# Patient Record
Sex: Female | Born: 1955 | Race: White | Hispanic: No | Smoking: Never smoker
Health system: Southern US, Community
[De-identification: ages and names within clinical notes are randomized; demographics above are authoritative.]

## PROBLEM LIST (undated history)

## (undated) DIAGNOSIS — M1990 Unspecified osteoarthritis, unspecified site: Secondary | ICD-10-CM

## (undated) DIAGNOSIS — I10 Essential (primary) hypertension: Secondary | ICD-10-CM

## (undated) DIAGNOSIS — Z7901 Long term (current) use of anticoagulants: Secondary | ICD-10-CM

## (undated) DIAGNOSIS — E785 Hyperlipidemia, unspecified: Secondary | ICD-10-CM

## (undated) DIAGNOSIS — K219 Gastro-esophageal reflux disease without esophagitis: Secondary | ICD-10-CM

## (undated) DIAGNOSIS — R7303 Prediabetes: Secondary | ICD-10-CM

## (undated) HISTORY — PX: BACK SURGERY: SHX140

## (undated) HISTORY — PX: ABDOMINAL HYSTERECTOMY: SHX81

## (undated) HISTORY — PX: OTHER SURGICAL HISTORY: SHX169

---

## 2013-11-03 ENCOUNTER — Ambulatory Visit: Payer: Self-pay | Admitting: Internal Medicine

## 2014-11-23 DIAGNOSIS — E7800 Pure hypercholesterolemia, unspecified: Secondary | ICD-10-CM | POA: Insufficient documentation

## 2014-11-23 DIAGNOSIS — M170 Bilateral primary osteoarthritis of knee: Secondary | ICD-10-CM | POA: Insufficient documentation

## 2014-11-28 ENCOUNTER — Other Ambulatory Visit: Payer: Self-pay | Admitting: Internal Medicine

## 2014-11-28 DIAGNOSIS — Z1231 Encounter for screening mammogram for malignant neoplasm of breast: Secondary | ICD-10-CM

## 2014-12-13 ENCOUNTER — Ambulatory Visit
Admission: RE | Admit: 2014-12-13 | Discharge: 2014-12-13 | Disposition: A | Discharge status: Home / Self Care | Payer: BLUE CROSS/BLUE SHIELD | Source: Ambulatory Visit | Attending: Internal Medicine | Admitting: Internal Medicine

## 2014-12-13 DIAGNOSIS — Z1231 Encounter for screening mammogram for malignant neoplasm of breast: Secondary | ICD-10-CM | POA: Insufficient documentation

## 2014-12-19 ENCOUNTER — Other Ambulatory Visit: Payer: Self-pay | Admitting: Internal Medicine

## 2014-12-19 DIAGNOSIS — R928 Other abnormal and inconclusive findings on diagnostic imaging of breast: Secondary | ICD-10-CM

## 2014-12-21 ENCOUNTER — Ambulatory Visit: Payer: BLUE CROSS/BLUE SHIELD

## 2014-12-21 ENCOUNTER — Ambulatory Visit
Admission: RE | Admit: 2014-12-21 | Discharge: 2014-12-21 | Disposition: A | Discharge status: Home / Self Care | Payer: BLUE CROSS/BLUE SHIELD | Source: Ambulatory Visit | Attending: Internal Medicine | Admitting: Internal Medicine

## 2014-12-21 DIAGNOSIS — R928 Other abnormal and inconclusive findings on diagnostic imaging of breast: Secondary | ICD-10-CM | POA: Diagnosis not present

## 2015-11-26 ENCOUNTER — Other Ambulatory Visit: Payer: Self-pay | Admitting: Internal Medicine

## 2015-11-26 DIAGNOSIS — Z1231 Encounter for screening mammogram for malignant neoplasm of breast: Secondary | ICD-10-CM

## 2015-12-09 ENCOUNTER — Ambulatory Visit
Admission: RE | Admit: 2015-12-09 | Discharge: 2015-12-09 | Disposition: A | Discharge status: Home / Self Care | Payer: BLUE CROSS/BLUE SHIELD | Source: Ambulatory Visit | Attending: Internal Medicine | Admitting: Internal Medicine

## 2015-12-09 DIAGNOSIS — Z1231 Encounter for screening mammogram for malignant neoplasm of breast: Secondary | ICD-10-CM | POA: Insufficient documentation

## 2016-11-27 ENCOUNTER — Other Ambulatory Visit: Payer: Self-pay | Admitting: Internal Medicine

## 2016-11-27 DIAGNOSIS — I10 Essential (primary) hypertension: Secondary | ICD-10-CM | POA: Insufficient documentation

## 2016-11-27 DIAGNOSIS — Z1231 Encounter for screening mammogram for malignant neoplasm of breast: Secondary | ICD-10-CM

## 2016-12-18 ENCOUNTER — Ambulatory Visit
Admission: RE | Admit: 2016-12-18 | Discharge: 2016-12-18 | Disposition: A | Discharge status: Home / Self Care | Payer: BLUE CROSS/BLUE SHIELD | Source: Ambulatory Visit | Attending: Internal Medicine | Admitting: Internal Medicine

## 2016-12-18 DIAGNOSIS — Z1231 Encounter for screening mammogram for malignant neoplasm of breast: Secondary | ICD-10-CM | POA: Diagnosis not present

## 2017-12-20 ENCOUNTER — Other Ambulatory Visit: Payer: Self-pay | Admitting: Internal Medicine

## 2017-12-20 DIAGNOSIS — Z1231 Encounter for screening mammogram for malignant neoplasm of breast: Secondary | ICD-10-CM

## 2018-01-07 ENCOUNTER — Ambulatory Visit
Admission: RE | Admit: 2018-01-07 | Discharge: 2018-01-07 | Disposition: A | Discharge status: Home / Self Care | Payer: BLUE CROSS/BLUE SHIELD | Source: Ambulatory Visit | Attending: Internal Medicine | Admitting: Internal Medicine

## 2018-01-07 DIAGNOSIS — Z1231 Encounter for screening mammogram for malignant neoplasm of breast: Secondary | ICD-10-CM | POA: Diagnosis not present

## 2018-04-01 ENCOUNTER — Ambulatory Visit: Admit: 2018-04-01 | Payer: BLUE CROSS/BLUE SHIELD | Admitting: Unknown Physician Specialty

## 2018-04-01 SURGERY — COLONOSCOPY WITH PROPOFOL
Anesthesia: General

## 2018-12-09 DIAGNOSIS — R7303 Prediabetes: Secondary | ICD-10-CM | POA: Insufficient documentation

## 2018-12-15 ENCOUNTER — Other Ambulatory Visit: Payer: Self-pay | Admitting: Internal Medicine

## 2018-12-15 DIAGNOSIS — Z1231 Encounter for screening mammogram for malignant neoplasm of breast: Secondary | ICD-10-CM

## 2019-01-10 ENCOUNTER — Other Ambulatory Visit: Payer: Self-pay

## 2019-01-10 ENCOUNTER — Ambulatory Visit
Admission: RE | Admit: 2019-01-10 | Discharge: 2019-01-10 | Disposition: A | Discharge status: Home / Self Care | Payer: BC Managed Care – PPO | Source: Ambulatory Visit | Attending: Internal Medicine | Admitting: Internal Medicine

## 2019-01-10 DIAGNOSIS — Z1231 Encounter for screening mammogram for malignant neoplasm of breast: Secondary | ICD-10-CM | POA: Insufficient documentation

## 2020-01-10 ENCOUNTER — Other Ambulatory Visit: Payer: Self-pay | Admitting: Internal Medicine

## 2020-01-10 DIAGNOSIS — Z1231 Encounter for screening mammogram for malignant neoplasm of breast: Secondary | ICD-10-CM

## 2020-01-12 LAB — COLOGUARD

## 2020-01-12 LAB — EXTERNAL GENERIC LAB PROCEDURE

## 2020-01-25 LAB — EXTERNAL GENERIC LAB PROCEDURE: COLOGUARD: NEGATIVE

## 2020-01-25 LAB — COLOGUARD: COLOGUARD: NEGATIVE

## 2020-02-15 ENCOUNTER — Other Ambulatory Visit
Admission: RE | Admit: 2020-02-15 | Discharge: 2020-02-15 | Disposition: A | Discharge status: Home / Self Care | Payer: BC Managed Care – PPO | Source: Ambulatory Visit | Attending: Gastroenterology | Admitting: Gastroenterology

## 2020-02-15 ENCOUNTER — Other Ambulatory Visit: Payer: Self-pay

## 2020-02-15 DIAGNOSIS — Z20822 Contact with and (suspected) exposure to covid-19: Secondary | ICD-10-CM | POA: Insufficient documentation

## 2020-02-15 LAB — SARS CORONAVIRUS 2 (TAT 6-24 HRS): SARS Coronavirus 2: NEGATIVE

## 2020-02-16 ENCOUNTER — Ambulatory Visit
Admission: RE | Admit: 2020-02-16 | Discharge: 2020-02-16 | Disposition: A | Discharge status: Home / Self Care | Payer: BC Managed Care – PPO | Source: Ambulatory Visit | Attending: Internal Medicine | Admitting: Internal Medicine

## 2020-02-16 DIAGNOSIS — Z1231 Encounter for screening mammogram for malignant neoplasm of breast: Secondary | ICD-10-CM | POA: Insufficient documentation

## 2020-02-19 ENCOUNTER — Encounter
Admission: RE | Disposition: A | Discharge status: Home / Self Care | Payer: Self-pay | Source: Home / Self Care | Attending: Gastroenterology

## 2020-02-19 ENCOUNTER — Encounter: Payer: Self-pay | Admitting: *Deleted

## 2020-02-19 ENCOUNTER — Ambulatory Visit: Payer: BC Managed Care – PPO | Admitting: Certified Registered"

## 2020-02-19 ENCOUNTER — Ambulatory Visit
Admission: RE | Admit: 2020-02-19 | Discharge: 2020-02-19 | Disposition: A | Discharge status: Home / Self Care | Payer: BC Managed Care – PPO | Attending: Gastroenterology | Admitting: Gastroenterology

## 2020-02-19 DIAGNOSIS — I10 Essential (primary) hypertension: Secondary | ICD-10-CM | POA: Diagnosis not present

## 2020-02-19 DIAGNOSIS — K2100 Gastro-esophageal reflux disease with esophagitis, without bleeding: Secondary | ICD-10-CM | POA: Diagnosis not present

## 2020-02-19 DIAGNOSIS — K2960 Other gastritis without bleeding: Secondary | ICD-10-CM | POA: Insufficient documentation

## 2020-02-19 DIAGNOSIS — Z79899 Other long term (current) drug therapy: Secondary | ICD-10-CM | POA: Diagnosis not present

## 2020-02-19 DIAGNOSIS — R1310 Dysphagia, unspecified: Secondary | ICD-10-CM | POA: Insufficient documentation

## 2020-02-19 HISTORY — DX: Essential (primary) hypertension: I10

## 2020-02-19 HISTORY — PX: ESOPHAGOGASTRODUODENOSCOPY (EGD) WITH PROPOFOL: SHX5813

## 2020-02-19 HISTORY — DX: Gastro-esophageal reflux disease without esophagitis: K21.9

## 2020-02-19 SURGERY — ESOPHAGOGASTRODUODENOSCOPY (EGD) WITH PROPOFOL
Anesthesia: General

## 2020-02-19 MED ORDER — PROPOFOL 10 MG/ML IV BOLUS
INTRAVENOUS | Status: DC | PRN
  Administered 2020-02-19: 40 mg via INTRAVENOUS
  Administered 2020-02-19 (×2): 10 mg via INTRAVENOUS

## 2020-02-19 MED ORDER — PHENYLEPHRINE HCL (PRESSORS) 10 MG/ML IV SOLN
INTRAVENOUS | Status: DC | PRN
  Administered 2020-02-19: 200 ug via INTRAVENOUS

## 2020-02-19 MED ORDER — GLYCOPYRROLATE 0.2 MG/ML IJ SOLN
INTRAMUSCULAR | Status: DC | PRN
  Administered 2020-02-19: .2 mg via INTRAVENOUS

## 2020-02-19 MED ORDER — LIDOCAINE HCL (CARDIAC) PF 100 MG/5ML IV SOSY
PREFILLED_SYRINGE | INTRAVENOUS | Status: DC | PRN
  Administered 2020-02-19: 100 mg via INTRAVENOUS

## 2020-02-19 MED ORDER — SODIUM CHLORIDE 0.9 % IV SOLN
INTRAVENOUS | Status: DC
  Administered 2020-02-19: 1000 mL via INTRAVENOUS

## 2020-02-19 MED ORDER — MIDAZOLAM HCL 2 MG/2ML IJ SOLN
INTRAMUSCULAR | Status: AC
  Filled 2020-02-19: qty 2

## 2020-02-19 MED ORDER — PROPOFOL 500 MG/50ML IV EMUL
INTRAVENOUS | Status: DC | PRN
  Administered 2020-02-19: 145 ug/kg/min via INTRAVENOUS

## 2020-02-19 NOTE — Anesthesia Preprocedure Evaluation (Signed)
Anesthesia Evaluation  Patient identified by MRN, date of birth, ID band Patient awake    Reviewed: Allergy & Precautions, H&P , NPO status , reviewed documented beta blocker date and time   Airway Mallampati: IV  TM Distance: >3 FB Neck ROM: full    Dental  (+) Teeth Intact   Pulmonary neg sleep apnea,    Pulmonary exam normal        Cardiovascular hypertension, Normal cardiovascular exam     Neuro/Psych    GI/Hepatic GERD  Controlled,  Endo/Other    Renal/GU      Musculoskeletal   Abdominal   Peds  Hematology   Anesthesia Other Findings Past Medical History: No date: GERD (gastroesophageal reflux disease) No date: Hypertension  Past Surgical History: No date: ABDOMINAL HYSTERECTOMY No date: BACK SURGERY No date: nodule on back  BMI    Body Mass Index: 47.13 kg/m      Reproductive/Obstetrics                             Anesthesia Physical Anesthesia Plan  ASA: III  Anesthesia Plan: General   Post-op Pain Management:    Induction: Intravenous  PONV Risk Score and Plan: Treatment may vary due to age or medical condition and TIVA  Airway Management Planned: Nasal Cannula and Natural Airway  Additional Equipment:   Intra-op Plan:   Post-operative Plan:   Informed Consent: I have reviewed the patients History and Physical, chart, labs and discussed the procedure including the risks, benefits and alternatives for the proposed anesthesia with the patient or authorized representative who has indicated his/her understanding and acceptance.     Dental Advisory Given  Plan Discussed with: CRNA  Anesthesia Plan Comments:         Anesthesia Quick Evaluation

## 2020-02-19 NOTE — H&P (Signed)
Outpatient short stay form Pre-procedure 02/19/2020 2:43 PM Merlyn Lot MD, MPH  Primary Physician: Dr. Dareen Piano  Reason for visit:  Chronic Dysphagia  History of present illness:  Patient with solid food dysphagia for over a year. No family history of esophageal cancer. No blood thinners.   No current facility-administered medications for this encounter.  Medications Prior to Admission  Medication Sig Dispense Refill Last Dose  . carvedilol (COREG) 12.5 MG tablet Take 12.5 mg by mouth 2 (two) times daily with a meal.    at 0900     No Known Allergies   Past Medical History:  Diagnosis Date  . GERD (gastroesophageal reflux disease)   . Hypertension     Review of systems:  Otherwise negative.    Physical Exam  Gen: Alert, oriented. Appears stated age.  HEENT: PERRLA. Lungs: No respiratory distress Abd: soft, benign, no masses Ext: No edema    Planned procedures: Proceed with EGD. The patient understands the nature of the planned procedure, indications, risks, alternatives and potential complications including but not limited to bleeding, infection, perforation, damage to internal organs and possible oversedation/side effects from anesthesia. The patient agrees and gives consent to proceed.  Please refer to procedure notes for findings, recommendations and patient disposition/instructions.     Merlyn Lot MD, MPH Gastroenterology 02/19/2020  2:43 PM

## 2020-02-19 NOTE — Transfer of Care (Signed)
Immediate Anesthesia Transfer of Care Note  Patient: Meredith Reyes  Procedure(s) Performed: ESOPHAGOGASTRODUODENOSCOPY (EGD) WITH PROPOFOL (N/A )  Patient Location: Endoscopy Unit  Anesthesia Type:General  Level of Consciousness: awake, drowsy and responds to stimulation  Airway & Oxygen Therapy: Patient Spontanous Breathing and Patient connected to face mask oxygen  Post-op Assessment: Report given to RN and Post -op Vital signs reviewed and stable  Post vital signs: Reviewed and stable  Last Vitals:  Vitals Value Taken Time  BP    Temp    Pulse 79 02/19/20 1558  Resp 24 02/19/20 1558  SpO2 99 % 02/19/20 1558  Vitals shown include unvalidated device data.  Last Pain:  Vitals:   02/19/20 1555  TempSrc:   PainSc: 0-No pain      Patients Stated Pain Goal: 0 (02/19/20 1443)  Complications: No complications documented.

## 2020-02-19 NOTE — Interval H&P Note (Signed)
History and Physical Interval Note:  02/19/2020 2:44 PM  Meredith Reyes  has presented today for surgery, with the diagnosis of DYSPHAGIA W /DIL.  The various methods of treatment have been discussed with the patient and family. After consideration of risks, benefits and other options for treatment, the patient has consented to  Procedure(s): ESOPHAGOGASTRODUODENOSCOPY (EGD) WITH PROPOFOL (N/A) as a surgical intervention.  The patient's history has been reviewed, patient examined, no change in status, stable for surgery.  I have reviewed the patient's chart and labs.  Questions were answered to the patient's satisfaction.     Regis Bill  Ok to proceed with EGD

## 2020-02-19 NOTE — Op Note (Signed)
[facility] Gastroenterology Patient Name: Meredith Reyes Procedure Date: 02/19/2020 3:33 PM MRN: [id number] Account #: [id number] Date of Birth: Jul 03, 1956 Admit Type: Outpatient Age: 64 Room: [facility] ENDO ROOM 2 Gender: Female Note Status: Finalized Procedure:             Upper GI endoscopy Indications:           Dysphagia Providers:             Andrey Farmer MD, MD Referring MD:          Ocie Cornfield. Ouida Sills MD, MD (Referring MD) Medicines:             Monitored Anesthesia Care Complications:         No immediate complications. Estimated blood loss:                         Minimal. Procedure:             Pre-Anesthesia Assessment:                        - Prior to the procedure, a History and Physical was                         performed, and patient medications and allergies were                         reviewed. The patient is competent. The risks and                         benefits of the procedure and the sedation options and                         risks were discussed with the patient. All questions                         were answered and informed consent was obtained.                         Patient identification and proposed procedure were                         verified by the physician, the nurse, the anesthetist                         and the technician in the endoscopy suite. Mental                         Status Examination: alert and oriented. Airway                         Examination: normal oropharyngeal airway and neck                         mobility. Respiratory Examination: clear to                         auscultation. CV Examination: normal. Prophylactic  Antibiotics: The patient does not require prophylactic                         antibiotics. Prior Anticoagulants: The patient has                         taken no previous anticoagulant or antiplatelet                         agents. ASA Grade Assessment: III  - A patient with                         severe systemic disease. After reviewing the risks and                         benefits, the patient was deemed in satisfactory                         condition to undergo the procedure. The anesthesia                         plan was to use monitored anesthesia care (MAC).                         Immediately prior to administration of medications,                         the patient was re-assessed for adequacy to receive                         sedatives. The heart rate, respiratory rate, oxygen                         saturations, blood pressure, adequacy of pulmonary                         ventilation, and response to care were monitored                         throughout the procedure. The physical status of the                         patient was re-assessed after the procedure.                        After obtaining informed consent, the endoscope was                         passed under direct vision. Throughout the procedure,                         the patient's blood pressure, pulse, and oxygen                         saturations were monitored continuously. The Endoscope                         was introduced through the mouth, and advanced to the  second part of duodenum. The upper GI endoscopy was                         accomplished without difficulty. The patient tolerated                         the procedure well. Findings:      No endoscopic abnormality was evident in the esophagus to explain the       patient's complaint of dysphagia. Biopsies were obtained from the       proximal and distal esophagus with cold forceps for histology of       suspected eosinophilic esophagitis.      LA Grade A (one or more mucosal breaks less than 5 mm, not extending       between tops of 2 mucosal folds) esophagitis with no bleeding was found.      Patchy mildly erythematous mucosa without bleeding was found in the        gastric antrum. Biopsies were taken with a cold forceps for Helicobacter       pylori testing. Estimated blood loss was minimal.      The examined duodenum was normal. Impression:            - No endoscopic esophageal abnormality to explain                         patient's dysphagia. Biopsied.                        - LA Grade A esophagitis with no bleeding.                        - Erythematous mucosa in the antrum. Biopsied.                        - Normal examined duodenum. Recommendation:        - Discharge patient to home.                        - Resume previous diet.                        - Continue present medications.                        - Await pathology results.                        - Return to referring physician as previously                         scheduled.                        - Follow an antireflux regimen.                        - Use Prilosec (omeprazole) 20 mg PO daily. Procedure Code(s):     --- Professional ---                        [phone], Esophagogastroduodenoscopy, flexible,  transoral; with biopsy, single or multiple Diagnosis Code(s):     --- Professional ---                        R13.10, Dysphagia, unspecified                        K20.90, Esophagitis, unspecified without bleeding                        K31.89, Other diseases of stomach and duodenum CPT copyright 2019 American Medical Association. All rights reserved. The codes documented in this report are preliminary and upon coder review may  be revised to meet current compliance requirements. Andrey Farmer, MD Andrey Farmer MD, MD 02/19/2020 3:55:38 PM Number of Addenda: 0 Note Initiated On: 02/19/2020 3:33 PM Estimated Blood Loss:  Estimated blood loss was minimal.      [facility]

## 2020-02-20 ENCOUNTER — Encounter: Payer: Self-pay | Admitting: Gastroenterology

## 2020-02-20 NOTE — Anesthesia Postprocedure Evaluation (Signed)
Anesthesia Post Note  Patient: ELIDE STALZER  Procedure(s) Performed: ESOPHAGOGASTRODUODENOSCOPY (EGD) WITH PROPOFOL (N/A )  Patient location during evaluation: Endoscopy Anesthesia Type: General Level of consciousness: awake and alert and oriented Pain management: pain level controlled Vital Signs Assessment: post-procedure vital signs reviewed and stable Respiratory status: spontaneous breathing, nonlabored ventilation and respiratory function stable Cardiovascular status: blood pressure returned to baseline and stable Postop Assessment: no signs of nausea or vomiting Anesthetic complications: no   No complications documented.   Last Vitals:  Vitals:   02/19/20 1610 02/19/20 1615  BP: 136/67 127/65  Pulse: 74 74  Resp: 20 20  Temp:    SpO2: 97% 98%    Last Pain:  Vitals:   02/19/20 1615  TempSrc:   PainSc: 0-No pain                 Ceceilia Cephus

## 2020-02-22 LAB — SURGICAL PATHOLOGY

## 2020-12-20 DIAGNOSIS — I10 Essential (primary) hypertension: Secondary | ICD-10-CM | POA: Diagnosis not present

## 2020-12-20 DIAGNOSIS — E7800 Pure hypercholesterolemia, unspecified: Secondary | ICD-10-CM | POA: Diagnosis not present

## 2020-12-20 DIAGNOSIS — Z0000 Encounter for general adult medical examination without abnormal findings: Secondary | ICD-10-CM | POA: Diagnosis not present

## 2020-12-20 DIAGNOSIS — Z23 Encounter for immunization: Secondary | ICD-10-CM | POA: Diagnosis not present

## 2020-12-20 DIAGNOSIS — R7303 Prediabetes: Secondary | ICD-10-CM | POA: Diagnosis not present

## 2020-12-20 DIAGNOSIS — Z6841 Body Mass Index (BMI) 40.0 and over, adult: Secondary | ICD-10-CM | POA: Diagnosis not present

## 2021-03-28 DIAGNOSIS — M170 Bilateral primary osteoarthritis of knee: Secondary | ICD-10-CM | POA: Diagnosis not present

## 2021-05-23 DIAGNOSIS — M170 Bilateral primary osteoarthritis of knee: Secondary | ICD-10-CM | POA: Diagnosis not present

## 2021-06-12 DIAGNOSIS — M25662 Stiffness of left knee, not elsewhere classified: Secondary | ICD-10-CM | POA: Diagnosis not present

## 2021-06-12 DIAGNOSIS — M25661 Stiffness of right knee, not elsewhere classified: Secondary | ICD-10-CM | POA: Diagnosis not present

## 2021-06-12 DIAGNOSIS — M6281 Muscle weakness (generalized): Secondary | ICD-10-CM | POA: Diagnosis not present

## 2021-06-12 DIAGNOSIS — M25562 Pain in left knee: Secondary | ICD-10-CM | POA: Diagnosis not present

## 2021-06-12 DIAGNOSIS — G8929 Other chronic pain: Secondary | ICD-10-CM | POA: Diagnosis not present

## 2021-06-12 DIAGNOSIS — M25561 Pain in right knee: Secondary | ICD-10-CM | POA: Diagnosis not present

## 2021-06-12 DIAGNOSIS — M170 Bilateral primary osteoarthritis of knee: Secondary | ICD-10-CM | POA: Diagnosis not present

## 2021-06-20 DIAGNOSIS — Z23 Encounter for immunization: Secondary | ICD-10-CM | POA: Diagnosis not present

## 2021-06-20 DIAGNOSIS — R7303 Prediabetes: Secondary | ICD-10-CM | POA: Diagnosis not present

## 2021-06-20 DIAGNOSIS — Z6841 Body Mass Index (BMI) 40.0 and over, adult: Secondary | ICD-10-CM | POA: Diagnosis not present

## 2021-06-20 DIAGNOSIS — M170 Bilateral primary osteoarthritis of knee: Secondary | ICD-10-CM | POA: Diagnosis not present

## 2021-06-20 DIAGNOSIS — I10 Essential (primary) hypertension: Secondary | ICD-10-CM | POA: Diagnosis not present

## 2021-06-20 DIAGNOSIS — M25562 Pain in left knee: Secondary | ICD-10-CM | POA: Diagnosis not present

## 2021-06-20 DIAGNOSIS — M25561 Pain in right knee: Secondary | ICD-10-CM | POA: Diagnosis not present

## 2021-06-20 DIAGNOSIS — M25662 Stiffness of left knee, not elsewhere classified: Secondary | ICD-10-CM | POA: Diagnosis not present

## 2021-06-20 DIAGNOSIS — G8929 Other chronic pain: Secondary | ICD-10-CM | POA: Diagnosis not present

## 2021-06-20 DIAGNOSIS — M6281 Muscle weakness (generalized): Secondary | ICD-10-CM | POA: Diagnosis not present

## 2021-06-20 DIAGNOSIS — M25661 Stiffness of right knee, not elsewhere classified: Secondary | ICD-10-CM | POA: Diagnosis not present

## 2021-07-03 DIAGNOSIS — G8929 Other chronic pain: Secondary | ICD-10-CM | POA: Diagnosis not present

## 2021-07-03 DIAGNOSIS — M25661 Stiffness of right knee, not elsewhere classified: Secondary | ICD-10-CM | POA: Diagnosis not present

## 2021-07-03 DIAGNOSIS — M25562 Pain in left knee: Secondary | ICD-10-CM | POA: Diagnosis not present

## 2021-07-03 DIAGNOSIS — M170 Bilateral primary osteoarthritis of knee: Secondary | ICD-10-CM | POA: Diagnosis not present

## 2021-07-03 DIAGNOSIS — M6281 Muscle weakness (generalized): Secondary | ICD-10-CM | POA: Diagnosis not present

## 2021-07-03 DIAGNOSIS — M25662 Stiffness of left knee, not elsewhere classified: Secondary | ICD-10-CM | POA: Diagnosis not present

## 2021-07-03 DIAGNOSIS — M25561 Pain in right knee: Secondary | ICD-10-CM | POA: Diagnosis not present

## 2021-07-18 DIAGNOSIS — M170 Bilateral primary osteoarthritis of knee: Secondary | ICD-10-CM | POA: Diagnosis not present

## 2021-07-18 DIAGNOSIS — M25661 Stiffness of right knee, not elsewhere classified: Secondary | ICD-10-CM | POA: Diagnosis not present

## 2021-07-18 DIAGNOSIS — M6281 Muscle weakness (generalized): Secondary | ICD-10-CM | POA: Diagnosis not present

## 2021-07-18 DIAGNOSIS — M25562 Pain in left knee: Secondary | ICD-10-CM | POA: Diagnosis not present

## 2021-07-18 DIAGNOSIS — M25662 Stiffness of left knee, not elsewhere classified: Secondary | ICD-10-CM | POA: Diagnosis not present

## 2021-07-18 DIAGNOSIS — M25561 Pain in right knee: Secondary | ICD-10-CM | POA: Diagnosis not present

## 2021-07-18 DIAGNOSIS — G8929 Other chronic pain: Secondary | ICD-10-CM | POA: Diagnosis not present

## 2021-08-05 DIAGNOSIS — M170 Bilateral primary osteoarthritis of knee: Secondary | ICD-10-CM | POA: Diagnosis not present

## 2021-08-25 NOTE — Discharge Instructions (Signed)
Instructions after Total Knee Replacement   Pearlena Ow P. Eris Breck, Jr., M.D.     Dept. of Orthopaedics & Sports Medicine  [facility]  [address]  [place], [place]  [address]  Phone: [phone]   Fax: [phone]    DIET: Drink plenty of non-alcoholic fluids. Resume your normal diet. Include foods high in fiber.  ACTIVITY:  You may use crutches or a walker with weight-bearing as tolerated, unless instructed otherwise. You may be weaned off of the walker or crutches by your Physical Therapist.  Do NOT place pillows under the knee. Anything placed under the knee could limit your ability to straighten the knee.   Continue doing gentle exercises. Exercising will reduce the pain and swelling, increase motion, and prevent muscle weakness.   Please continue to use the TED compression stockings for 6 weeks. You may remove the stockings at night, but should reapply them in the morning. Do not drive or operate any equipment until instructed.  WOUND CARE:  Continue to use the PolarCare or ice packs periodically to reduce pain and swelling. You may bathe or shower after the staples are removed at the first office visit following surgery.  MEDICATIONS: You may resume your regular medications. Please take the pain medication as prescribed on the medication. Do not take pain medication on an empty stomach. You have been given a prescription for a blood thinner (Lovenox or Coumadin). Please take the medication as instructed. (NOTE: After completing a 2 week course of Lovenox, take one Enteric-coated aspirin once a day. This along with elevation will help reduce the possibility of phlebitis in your operated leg.) Do not drive or drink alcoholic beverages when taking pain medications.  CALL THE OFFICE FOR: Temperature above 101 degrees Excessive bleeding or drainage on the dressing. Excessive swelling, coldness, or paleness of the toes. Persistent nausea and vomiting.  FOLLOW-UP:  You  should have an appointment to return to the office in 10-14 days after surgery. Arrangements have been made for continuation of Physical Therapy (either home therapy or outpatient therapy).   [facility] Department Directory         [email or web address]       [email or web address]          Cardiology  Appointments: [place] - [phone] [place] - [phone]  Endocrinology  Appointments: [place] - [phone] [place] - [phone]  Gastroenterology  Appointments: [place] - [phone] [place] - [phone]        General Surgery   Appointments: [place] - [phone]  Internal Medicine/Family Medicine  Appointments: [place] - [phone] [place] - [phone] [place] - [phone]  Metabolic and Weigh Loss Surgery  Appointments: [place] - [phone]        Neurology  Appointments: [place] - [phone] [place] - [phone]  Neurosurgery  Appointments: [place] - [phone]  Obstetrics & Gynecology  Appointments: [place] - [phone] [place] - [phone]        Pediatrics  Appointments: [place] - [phone] [place] - [phone]  Physiatry  Appointments: [place] -[phone]  Physical Therapy  Appointments: [place] - [phone] [place] - [phone]        Podiatry  Appointments: [place] - [phone] [place] - [phone]  Pulmonology  Appointments: [place] - [phone]  Rheumatology  Appointments: [place] - [phone]        [place] Location: [facility]  [address] , [place]  [address]  [place] Location: [facility] [address] [place], [place]  [address]  [place] Location: [facility] [address] [place], [place]  [address]    

## 2021-08-29 ENCOUNTER — Other Ambulatory Visit: Payer: Self-pay

## 2021-08-29 ENCOUNTER — Encounter
Admission: RE | Admit: 2021-08-29 | Discharge: 2021-08-29 | Disposition: A | Discharge status: Home / Self Care | Payer: Medicare HMO | Source: Ambulatory Visit | Attending: Orthopedic Surgery | Admitting: Orthopedic Surgery

## 2021-08-29 VITALS — BP 142/62 | HR 76 | Temp 97.6°F | Resp 20 | Ht 67.0 in | Wt 263.1 lb

## 2021-08-29 DIAGNOSIS — Z01812 Encounter for preprocedural laboratory examination: Secondary | ICD-10-CM

## 2021-08-29 DIAGNOSIS — Z01818 Encounter for other preprocedural examination: Secondary | ICD-10-CM | POA: Insufficient documentation

## 2021-08-29 DIAGNOSIS — I4891 Unspecified atrial fibrillation: Secondary | ICD-10-CM

## 2021-08-29 DIAGNOSIS — M1711 Unilateral primary osteoarthritis, right knee: Secondary | ICD-10-CM | POA: Insufficient documentation

## 2021-08-29 HISTORY — DX: Unspecified atrial fibrillation: I48.91

## 2021-08-29 LAB — COMPREHENSIVE METABOLIC PANEL
ALT: 15 U/L (ref 0–44)
AST: 17 U/L (ref 15–41)
Albumin: 3 g/dL — ABNORMAL LOW (ref 3.5–5.0)
Alkaline Phosphatase: 205 U/L — ABNORMAL HIGH (ref 38–126)
Anion gap: 7 (ref 5–15)
BUN: 16 mg/dL (ref 8–23)
CO2: 27 mmol/L (ref 22–32)
Calcium: 8.4 mg/dL — ABNORMAL LOW (ref 8.9–10.3)
Chloride: 103 mmol/L (ref 98–111)
Creatinine, Ser: 0.52 mg/dL (ref 0.44–1.00)
GFR, Estimated: >60 mL/min (ref >60)
Glucose, Bld: 97 mg/dL (ref 70–99)
Potassium: 3.7 mmol/L (ref 3.5–5.1)
Sodium: 137 mmol/L (ref 135–145)
Total Bilirubin: 0.5 mg/dL (ref 0.3–1.2)
Total Protein: 7.6 g/dL (ref 6.5–8.1)

## 2021-08-29 LAB — CBC
HCT: 37.4 % (ref 36.0–46.0)
Hemoglobin: 11.7 g/dL — ABNORMAL LOW (ref 12.0–15.0)
MCH: 28.2 pg (ref 26.0–34.0)
MCHC: 31.3 g/dL (ref 30.0–36.0)
MCV: 90.1 fL (ref 80.0–100.0)
Platelets: 345 10*3/uL (ref 150–400)
RBC: 4.15 MIL/uL (ref 3.87–5.11)
RDW: 14.1 % (ref 11.5–15.5)
WBC: 5.6 10*3/uL (ref 4.0–10.5)
nRBC: 0 % (ref 0.0–0.2)

## 2021-08-29 LAB — URINALYSIS, MICROSCOPIC (REFLEX)

## 2021-08-29 LAB — TYPE AND SCREEN
ABO/RH(D): O NEG
Antibody Screen: NEGATIVE

## 2021-08-29 LAB — SURGICAL PCR SCREEN
MRSA, PCR: NEGATIVE
Staphylococcus aureus: NEGATIVE

## 2021-08-29 LAB — URINALYSIS, ROUTINE W REFLEX MICROSCOPIC
Glucose, UA: 100 mg/dL — AB
Hgb urine dipstick: NEGATIVE
Leukocytes,Ua: NEGATIVE
Nitrite: NEGATIVE
Specific Gravity, Urine: 1.02 (ref 1.005–1.030)
pH: 7 (ref 5.0–8.0)

## 2021-08-29 LAB — SEDIMENTATION RATE: Sed Rate: 7 mm/hr (ref 0–30)

## 2021-08-29 LAB — C-REACTIVE PROTEIN: CRP: 2.2 mg/dL — ABNORMAL HIGH (ref <1.0)

## 2021-08-29 NOTE — Progress Notes (Signed)
°  Perioperative Services Pre-Admission/Anesthesia Testing    Date: 08/29/21  Name: NATYLEE WALLOCK MRN:   [id number]  Re: ECG changes and need for preoperative cardiovascular assessment  Planned Surgical Procedure(s):    Case: [id number] Date/Time: 09/12/21 0700   Procedure: COMPUTER ASSISTED TOTAL KNEE ARTHROPLASTY - RNFA (Right: Knee)   Anesthesia type: Choice   Pre-op diagnosis: PRIMARY OSTEOARTHRITIS OF RIGHT KNEE.   Location: [facility] OR ROOM 03 / [place] ORS FOR ANESTHESIA GROUP   Surgeons: Dereck Leep, MD   Clinical Notes:  Patient is scheduled for the above procedure on 09/12/2021 with Dr. Skip Estimable, MD.  In preparation for her procedure, patient presented to the PAT clinic on 08/29/2021 for labs and ECG testing.  In review of her ECG, patient noted to be and controlled atrial fibrillation at a rate of 72 bpm.  Patient denies previous history of any known arrhythmias.  She denies any known history of significant cardiovascular diagnoses.  She reports that she has never been seen by cardiology in the past.  PMH significant for hypertension only for which patient uses carvedilol 12.5 mg twice daily.  Patient denies any associated symptoms; no chest pain, shortness of breath, PND, orthopnea, palpitations, vertiginous symptoms, or presyncope/syncope.  Patient with lower extremity edema noted in clinic.  Daughter reports that patient has "been more fatigued" over the course of the last several months.  ECG Tracing:    Impression and Plan:  JAMELYN RISTINE presents for preadmission testing today and noted to be in new onset atrial fibrillation.  Patient reporting that she has not had an ECG since she was in her 10s, therefore there are no previous ECGs for review at the time of patient was in the clinic.  PMH significant for hypertension only; patient on beta-blocker therapy.  In efforts to promote a safe procedural/anesthetic course, will refer patient to cardiology for further evaluation  and preoperative clearance.  Discussed that she will likely need TTE to evaluate for valvular abnormalities in the setting of her presumably acute dysrhythmia. Patient offered [facility] and [facility]; advises that she would rather be seen by cardiology at [facility]. Will send referral and copy of ECG today.  Additionally, will send note to primary attending surgeon Marry Guan, MD) to make him aware of acute ECG findings and plans for cardiovascular consult. No changes are being made to her surgical date at this time.  Honor Loh, MSN, APRN, FNP-C, CEN [facility]  Peri-operative Services Nurse Practitioner Phone: [phone] 08/29/21 10:27 AM  NOTE: This note has been prepared using Dragon dictation software. Despite my best ability to proofread, there is always the potential that unintentional transcriptional errors may still occur from this process.

## 2021-08-29 NOTE — Patient Instructions (Addendum)
Your procedure is scheduled on: Friday September 12, 2021. Report to Day Surgery inside [facility] 2nd floor stop by admissions desk before getting on elevator.  To find out your arrival time please call [phone] between 1PM - 3PM on Thursday 9, 2023.  Remember: Instructions that are not followed completely may result in serious medical risk,  up to and including death, or upon the discretion of your surgeon and anesthesiologist your  surgery may need to be rescheduled.     _X__ 1. Do not eat food after midnight the night before your procedure.                 No chewing gum or hard candies. You may drink clear liquids up to 2 hours                 before you are scheduled to arrive for your surgery- DO not drink clear                 liquids within 2 hours of the start of your surgery.                 Clear Liquids include:  water, apple juice without pulp, clear Gatorade, G2 or                  Gatorade Zero (avoid Red/Purple/Blue), Black Coffee or Tea (Do not add                 anything to coffee or tea).  __X__2.   Complete the "Ensure Clear Pre-surgery Clear Carbohydrate Drink" provided to you, 2 hours before arrival. **If you are diabetic you will be provided with an alternative drink, Gatorade Zero or G2.  __X__3.  On the morning of surgery brush your teeth with toothpaste and water, you                may rinse your mouth with mouthwash if you wish.  Do not swallow any toothpaste of mouthwash.     _X__ 4.  No Alcohol for 24 hours before or after surgery.   _X__ 5.  Do Not Smoke or use e-cigarettes For 24 Hours Prior to Your Surgery.                 Do not use any chewable tobacco products for at least 6 hours prior to                 Surgery.  _X__  6.  Do not use any recreational drugs (marijuana, cocaine, heroin, ecstasy, MDMA or other)                For at least one week prior to your surgery.  Combination of these drugs with anesthesia                 May have life threatening results.  ____  7.  Bring all medications with you on the day of surgery if instructed.   __X__8.  Notify your doctor if there is any change in your medical condition      (cold, fever, infections).     Do not wear jewelry, make-up, hairpins, clips or nail polish. Do not wear lotions, powders, or perfumes. You may wear deodorant. Do not shave 48 hours prior to surgery. Men may shave face and neck. Do not bring valuables to the hospital.    [facility] is not responsible for any belongings or valuables.  Contacts, dentures  or bridgework may not be worn into surgery. Leave your suitcase in the car. After surgery it may be brought to your room. For patients admitted to the hospital, discharge time is determined by your treatment team.   Patients discharged the day of surgery will not be allowed to drive home.   Make arrangements for someone to be with you for the first 24 hours of your Same Day Discharge.    __X__ Take these medicines the morning of surgery with A SIP OF WATER:    1. carvedilol (COREG) 12.5 mg  2. omeprazole (PRILOSEC) 20 MG   3.   4.  5.  6.  ____ Fleet Enema (as directed)   __X__ Use CHG Soap (or wipes) as directed  ____ Use Benzoyl Peroxide Gel as instructed  ____ Use inhalers on the day of surgery  ____ Stop metformin 2 days prior to surgery    ____ Take 1/2 of usual insulin dose the night before surgery. No insulin the morning          of surgery.   ____ Call your PCP, cardiologist, or Pulmonologist if taking Coumadin/Plavix/aspirin and ask when to stop before your surgery.   __X__ One Week prior to surgery- Stop Anti-inflammatories such as Ibuprofen, Aleve, Advil, Motrin, meloxicam (MOBIC), diclofenac, etodolac, ketorolac, Toradol, Daypro, piroxicam, Goody's or BC powders. OK TO USE TYLENOL IF NEEDED   __X__ Do not start any vitamins and or herbal supplements until after surgery.    ____ Bring C-Pap to the  hospital.    If you have any questions regarding your pre-procedure instructions,  Please call Pre-admit Testing at [phone]

## 2021-09-03 ENCOUNTER — Encounter: Payer: Self-pay | Admitting: Orthopedic Surgery

## 2021-09-03 DIAGNOSIS — I480 Paroxysmal atrial fibrillation: Secondary | ICD-10-CM | POA: Diagnosis not present

## 2021-09-03 DIAGNOSIS — I4819 Other persistent atrial fibrillation: Secondary | ICD-10-CM | POA: Diagnosis not present

## 2021-09-03 DIAGNOSIS — I10 Essential (primary) hypertension: Secondary | ICD-10-CM | POA: Diagnosis not present

## 2021-09-03 DIAGNOSIS — E7800 Pure hypercholesterolemia, unspecified: Secondary | ICD-10-CM | POA: Diagnosis not present

## 2021-09-04 ENCOUNTER — Telehealth: Payer: Self-pay | Admitting: Urgent Care

## 2021-09-04 ENCOUNTER — Encounter: Payer: Self-pay | Admitting: Orthopedic Surgery

## 2021-09-04 NOTE — Progress Notes (Signed)
°  Perioperative Services Pre-Admission/Anesthesia Testing    Date: 09/04/21  Name: Meredith Reyes MRN:   [id number]  Re: New onset A.fib and plans for orthopedic surgery  Clinical Notes:  Patient is scheduled to undergo an elective RIGHT computer-assisted total knee arthroplasty on 09/12/2021 with Dr. Francesco Sor.  Preparation for the procedure, patient presented to the PAT clinic for preoperative labs and ECG.  Patient was found to be onset atrial fibrillation; mostly asymptomatic with the exception of fatigue.  Patient subsequently referred to cardiology for further evaluation.  Patient was seen in consult on 09/03/2021 by Dr. Renato Battles, MD; notes reviewed.  At the time of her clinic visit, patient reported being asymptomatic.  She denied chest pain, short of breath, PND, orthopnea, palpitations, vertiginous symptoms, or presyncope/syncope.  Patient with chronic peripheral edema that was noted to be mild.  Patient is not very active at baseline.  She does not have a formal exercise regimen.  Due to her arthritides, patient unable to ambulate in the past 3-6 months.  ECG repeated patient noted to be in atrial fibrillation at a rate of 83 bpm.  Blood pressure mildly elevated at 144/82 on beta-blocker monotherapy.  She was not taking any type of lipid-lowering therapies for ASCVD prevention.  She is not diabetic.  CHA2DS2-VASc Score = 3 (age, sex, HTN).  Given new onset atrial arrhythmia, cardiology plans to pursue further work-up via 72-hour monitor and TTE. Theses studies do not need to be completed prior to her upcoming surgery. Patient to follow up with outpatient cardiology postoperative following the completion of the aforementioned studies. Patient to continue carvedilol BID and will plan to start chronic daily anticoagulation postoperatively when deemed appropriate by her primary attending surgeon. Barring complications, cardiology anticipates her starting daily apixaban on/around  09/13/2021.  Impression and Plan:  [name] found to be in new onset atrial fibrillation during PAT appointment.  She has been seen in consult by cardiology with plans for further work-up and ongoing management. With that being said, per cardiology, "this patient is optimized for surgery and may proceed with the planned procedural course with a LOW risk of significant perioperative cardiovascular complications".   Available labs, pertinent testing, and imaging results were personally reviewed by me. Based on clinical review performed today (09/04/21), barring any significant acute changes in the patient's overall condition, it is anticipated that she will be able to proceed with the planned surgical intervention. Any acute changes in clinical condition may necessitate her procedure being postponed and/or cancelled. Patient will meet with anesthesia team (MD and/or CRNA) on this day of her procedure for preoperative evaluation/assessment.   Pre-surgical instructions were reviewed with the patient during her PAT appointment and questions were fielded by PAT clinical staff. Patient was advised that if any questions or concerns arise prior to her procedure then she should return a call to PAT and/or her surgeon's office to discuss.  Quentin Mulling, MSN, APRN, FNP-C, CEN [facility]  Peri-operative Services Nurse Practitioner Phone: [phone] 09/04/21 7:57 AM  NOTE: This note has been prepared using Dragon dictation software. Despite my best ability to proofread, there is always the potential that unintentional transcriptional errors may still occur from this process.

## 2021-09-07 NOTE — H&P (Signed)
ORTHOPAEDIC HISTORY & PHYSICAL Gwenlyn Fudge, [place] - 09/02/2021 10:45 AM EST Formatting of this note is different from the original. [place] [facility] Chief Complaint:   Chief Complaint  Patient presents with   Knee Pain  H & P RIGHT KNEE   History of Present Illness:   Meredith Reyes is a 66 y.o. female that presents to clinic today for her preoperative history and evaluation. Patient presents with her daughter. The patient is scheduled to undergo a right total knee arthroplasty on 09/02/21 by Dr. Marry Guan. Her pain began several years ago. The pain is located primarily along the medial aspect of the knee. She describes her pain as worse with weightbearing. She reports associated swelling with some giving way of the knee. She denies associated numbness or tingling, denies locking.   The patient's symptoms have progressed to the point that they decrease her quality of life. The patient has previously undergone conservative treatment including NSAIDS and injections to the knee without adequate control of her symptoms.  Denies history of lumbar surgery, denies history of DVT. Denies significant cardiac history but pre-op EKG showed atrial fibrillation. She has not yet been evaluated by cardiology.  Patient will have her daughter and grandson at home after surgery to assist her.   Past Medical, Surgical, Family, Social History, Allergies, Medications:   Past Medical History:  Past Medical History:  Diagnosis Date   HTN, goal below 140/90 11/27/2016   Hypercholesterolemia   Morbid obesity with BMI of 45.0-49.9, adult (CMS-HCC) 11/23/2014   Primary osteoarthritis of both knees 11/23/2014   Past Surgical History:  Past Surgical History:  Procedure Laterality Date   [place]   EGD 02/19/2020  Gastritis/Otherwise normal EGD bx/No Repeat/CTL   Current Medications:  Current Outpatient Medications  Medication Sig  Dispense Refill   acetaminophen (TYLENOL) 650 MG ER tablet Take 650 mg by mouth 2 (two) times daily as needed   carvediloL (COREG) 12.5 MG tablet TAKE 1 TABLET BY MOUTH TWICE DAILY WITH MEALS 180 tablet 1   omeprazole (PRILOSEC) 20 MG DR capsule Take 20 mg by mouth once daily   No current facility-administered medications for this visit.   Allergies:  Allergies  Allergen Reactions   Pravastatin Muscle Pain and Other (See Comments)  Muscle pain.   Social History:  Social History   Socioeconomic History   Marital status: Widowed   Number of children: 1   Years of education: 12   Highest education level: High school graduate  Occupational History   Occupation: Administrator  Tobacco Use   Smoking status: Never  Passive exposure: Yes   Smokeless tobacco: Never   Tobacco comments:  Second hand smoke exposure in childhood  Vaping Use   Vaping Use: Never used  Substance and Sexual Activity   Alcohol use: No   Drug use: Never   Family History:  Family History  Problem Relation Age of Onset   Diabetes type II Mother   Myocardial Infarction (Heart attack) Mother   Allergic rhinitis Mother   Coronary Artery Disease (Blocked arteries around heart) Father   Myocardial Infarction (Heart attack) Father   Colon cancer Neg Hx   Colon polyps Neg Hx   Review of Systems:   A 10+ ROS was performed, reviewed, and the pertinent orthopaedic findings are documented in the HPI.   Physical Examination:   BP (!) 146/86 (BP Location: Left upper arm, Patient Position: Sitting, BP Cuff Size:  Large Adult)   Ht 170.2 cm (5\' 7" )   Wt (!) 117 kg (258 lb)   BMI 40.41 kg/m   Patient is a well-developed, well-nourished female in no acute distress. Patient has normal mood and affect. Patient is alert and oriented to person, place, and time.   HEENT: Atraumatic, normocephalic. Pupils equal and reactive to light. Extraocular motion intact. Noninjected sclera.  Cardiovascular: Irregular rate  and rhythm, with no murmurs, rubs, or gallops. Distal pulses palpable.  Respiratory: Lungs clear to auscultation bilaterally.   Right Knee: Soft tissue swelling: mild Effusion: minimal Erythema: none Crepitance: moderate Tenderness: medial Alignment: relative varus Mediolateral laxity: medial pseudolaxity Posterior sag: negative Patellar tracking: Good tracking without evidence of subluxation or tilt Atrophy: Generalized quadriceps atrophy.  Quadriceps tone was fair to good. Range of motion: 0/25/75 degrees  Sensation intact over the saphenous, lateral sural cutaneous, superficial fibular, and deep fibular nerve distributions.  Tests Performed/Reviewed:  X-rays  X-ray knee right 3 views  X-rays of the right knee were reviewed which showed complete loss of medial compartment joint space with bone-on-bone contact and significant osteophyte formation. Erosion of the medial tibial plateau noted. Significant loss of patellofemoral joint space with osteophyte formation noted. No fractures.  Impression:   ICD-10-CM  1. Primary osteoarthritis of right knee M17.11  2. Severe obesity (BMI >= 40) (CMS-HCC) E66.01   Plan:   The patient has end-stage degenerative changes of the right knee. It was explained to the patient that the condition is progressive in nature. Having failed conservative treatment, the patient has elected to proceed with a total joint arthroplasty. The patient will undergo a total joint arthroplasty with Dr. Marry Guan. The risks of surgery, including blood clot and infection, were discussed with the patient. Measures to reduce these risks, including the use of anticoagulation, perioperative antibiotics, and early ambulation were discussed. The importance of postoperative physical therapy was discussed with the patient. The patient elects to proceed with surgery. The patient is instructed to stop all blood thinners prior to surgery. The patient is instructed to call the  hospital the day before surgery to learn of the proper arrival time.   Patient will likely require cardiac clearance prior to surgery. Reached out to [facility] who is going to resend urgent referral to cardiology.  Contact our office with any questions or concerns. Follow up as indicated, or sooner should any new problems arise, if conditions worsen, or if they are otherwise concerned.   Gwenlyn Fudge, [facility] [place], [place] [address] Phone: [phone]  This note was generated in part with voice recognition software and I apologize for any typographical errors that were not detected and corrected.  Electronically signed by Gwenlyn Fudge, PA at 09/02/2021 1:15 PM EST

## 2021-09-10 ENCOUNTER — Other Ambulatory Visit: Admission: RE | Admit: 2021-09-10 | Payer: BC Managed Care – PPO | Source: Ambulatory Visit

## 2021-09-12 DIAGNOSIS — M1711 Unilateral primary osteoarthritis, right knee: Secondary | ICD-10-CM

## 2021-09-22 DIAGNOSIS — I4819 Other persistent atrial fibrillation: Secondary | ICD-10-CM | POA: Diagnosis not present

## 2021-09-24 ENCOUNTER — Encounter
Admission: RE | Admit: 2021-09-24 | Discharge: 2021-09-24 | Disposition: A | Discharge status: Home / Self Care | Payer: BC Managed Care – PPO | Source: Ambulatory Visit | Attending: Orthopedic Surgery | Admitting: Orthopedic Surgery

## 2021-09-24 ENCOUNTER — Other Ambulatory Visit: Payer: BC Managed Care – PPO

## 2021-09-24 ENCOUNTER — Other Ambulatory Visit: Payer: Self-pay

## 2021-09-24 DIAGNOSIS — Z01818 Encounter for other preprocedural examination: Secondary | ICD-10-CM

## 2021-09-24 NOTE — Patient Instructions (Signed)
Your procedure is scheduled on:10-03-21 Friday Report to the Registration Desk on the 1st floor of the Medical Mall.Then proceed to the 2nd floor Surgery Desk in the Medical Mall To find out your arrival time, please call [phone] between 1PM - 3PM on:10-02-21 Thursday  REMEMBER: Instructions that are not followed completely may result in serious medical risk, up to and including death; or upon the discretion of your surgeon and anesthesiologist your surgery may need to be rescheduled.  Do not eat food after midnight the night before surgery.  No gum chewing, lozengers or hard candies.  You may however, drink CLEAR liquids up to 2 hours before you are scheduled to arrive for your surgery. Do not drink anything within 2 hours of your scheduled arrival time.  Clear liquids include: - water  - apple juice without pulp - gatorade (not RED colors) - black coffee or tea (Do NOT add milk or creamers to the coffee or tea) Do NOT drink anything that is not on this list.  In addition, your doctor has ordered for you to drink the provided  Ensure Pre-Surgery Clear Carbohydrate Drink  Drinking this carbohydrate drink up to two hours before surgery helps to reduce insulin resistance and improve patient outcomes. Please complete drinking 2 hours prior to scheduled arrival time.  TAKE THESE MEDICATIONS THE MORNING OF SURGERY WITH A SIP OF WATER: -carvedilol (COREG) -omeprazole (PRILOSEC)-take one the night before and one on the morning of surgery - helps to prevent nausea after surgery.)  One week prior to surgery:(Last dose 09-25-21) Stop Anti-inflammatories (NSAIDS) such as Advil, Aleve, Ibuprofen, Motrin, Naproxen, Naprosyn and Aspirin based products such as Excedrin, Goodys Powder, BC Powder.You may however, continue to take Tylenol if needed for pain up until the day of surgery.  Stop ANY OVER THE COUNTER supplements/vitamins 7 days prior to surgery   No Alcohol for 24 hours before or after  surgery.  No Smoking including e-cigarettes for 24 hours prior to surgery.  No chewable tobacco products for at least 6 hours prior to surgery.  No nicotine patches on the day of surgery.  Do not use any "recreational" drugs for at least a week prior to your surgery.  Please be advised that the combination of cocaine and anesthesia may have negative outcomes, up to and including death. If you test positive for cocaine, your surgery will be cancelled.  On the morning of surgery brush your teeth with toothpaste and water, you may rinse your mouth with mouthwash if you wish. Do not swallow any toothpaste or mouthwash.  Use CHG Soap as directed on instruction sheet.  Do not wear jewelry, make-up, hairpins, clips or nail polish.  Do not wear lotions, powders, or perfumes.   Do not shave body from the neck down 48 hours prior to surgery just in case you cut yourself which could leave a site for infection.  Also, freshly shaved skin may become irritated if using the CHG soap.  Contact lenses, hearing aids and dentures may not be worn into surgery.  Do not bring valuables to the hospital. [facility] is not responsible for any missing/lost belongings or valuables.  Notify your doctor if there is any change in your medical condition (cold, fever, infection).  Wear comfortable clothing (specific to your surgery type) to the hospital.  After surgery, you can help prevent lung complications by doing breathing exercises.  Take deep breaths and cough every 1-2 hours. Your doctor may order a device called an Facilities manager  to help you take deep breaths. When coughing or sneezing, hold a pillow firmly against your incision with both hands. This is called splinting. Doing this helps protect your incision. It also decreases belly discomfort.  If you are being admitted to the hospital overnight, leave your suitcase in the car. After surgery it may be brought to your room.  If you are being  discharged the day of surgery, you will not be allowed to drive home. You will need a responsible adult (18 years or older) to drive you home and stay with you that night.   If you are taking public transportation, you will need to have a responsible adult (18 years or older) with you. Please confirm with your physician that it is acceptable to use public transportation.   Please call the Pre-admissions Testing Dept. at [phone] if you have any questions about these instructions.  Surgery Visitation Policy:  Patients undergoing a surgery or procedure may have one family member or support person with them as long as that person is not COVID-19 positive or experiencing its symptoms.  That person may remain in the waiting area during the procedure and may rotate out with other people.  Inpatient Visitation:    Visiting hours are 7 a.m. to 8 p.m. Up to two visitors ages 16+ are allowed at one time in a patient room. The visitors may rotate out with other people during the day. Visitors must check out when they leave, or other visitors will not be allowed. One designated support person may remain overnight. The visitor must pass COVID-19 screenings, use hand sanitizer when entering and exiting the patients room and wear a mask at all times, including in the patients room. Patients must also wear a mask when staff or their visitor are in the room. Masking is required regardless of vaccination status.

## 2021-09-30 DIAGNOSIS — I4819 Other persistent atrial fibrillation: Secondary | ICD-10-CM | POA: Diagnosis not present

## 2021-10-01 ENCOUNTER — Other Ambulatory Visit: Payer: Self-pay

## 2021-10-01 ENCOUNTER — Other Ambulatory Visit: Payer: BC Managed Care – PPO

## 2021-10-01 ENCOUNTER — Encounter
Admission: RE | Admit: 2021-10-01 | Discharge: 2021-10-01 | Disposition: A | Discharge status: Home / Self Care | Payer: Medicare HMO | Source: Ambulatory Visit | Attending: Orthopedic Surgery | Admitting: Orthopedic Surgery

## 2021-10-01 DIAGNOSIS — Z1152 Encounter for screening for COVID-19: Secondary | ICD-10-CM

## 2021-10-01 DIAGNOSIS — Z01818 Encounter for other preprocedural examination: Secondary | ICD-10-CM

## 2021-10-01 DIAGNOSIS — Z01812 Encounter for preprocedural laboratory examination: Secondary | ICD-10-CM | POA: Diagnosis not present

## 2021-10-01 DIAGNOSIS — I4819 Other persistent atrial fibrillation: Secondary | ICD-10-CM | POA: Diagnosis not present

## 2021-10-01 DIAGNOSIS — Z20822 Contact with and (suspected) exposure to covid-19: Secondary | ICD-10-CM | POA: Diagnosis not present

## 2021-10-01 LAB — TYPE AND SCREEN
ABO/RH(D): O NEG
Antibody Screen: NEGATIVE

## 2021-10-02 ENCOUNTER — Encounter: Payer: Self-pay | Admitting: Orthopedic Surgery

## 2021-10-02 LAB — SARS CORONAVIRUS 2 (TAT 6-24 HRS): SARS Coronavirus 2: NEGATIVE

## 2021-10-03 ENCOUNTER — Observation Stay: Payer: Medicare HMO

## 2021-10-03 ENCOUNTER — Encounter: Payer: Self-pay | Admitting: Orthopedic Surgery

## 2021-10-03 ENCOUNTER — Other Ambulatory Visit: Payer: Self-pay

## 2021-10-03 ENCOUNTER — Observation Stay
Admission: RE | Admit: 2021-10-03 | Discharge: 2021-10-04 | Disposition: A | Discharge status: Home Health | Payer: Medicare HMO | Attending: Orthopedic Surgery | Admitting: Orthopedic Surgery

## 2021-10-03 ENCOUNTER — Ambulatory Visit: Payer: Medicare HMO | Admitting: Urgent Care

## 2021-10-03 ENCOUNTER — Encounter
Admission: RE | Disposition: A | Discharge status: Home Health | Payer: Self-pay | Source: Home / Self Care | Attending: Orthopedic Surgery

## 2021-10-03 DIAGNOSIS — M1711 Unilateral primary osteoarthritis, right knee: Principal | ICD-10-CM | POA: Insufficient documentation

## 2021-10-03 DIAGNOSIS — Z471 Aftercare following joint replacement surgery: Secondary | ICD-10-CM | POA: Diagnosis not present

## 2021-10-03 DIAGNOSIS — I10 Essential (primary) hypertension: Secondary | ICD-10-CM | POA: Insufficient documentation

## 2021-10-03 DIAGNOSIS — I4819 Other persistent atrial fibrillation: Secondary | ICD-10-CM | POA: Diagnosis not present

## 2021-10-03 DIAGNOSIS — K219 Gastro-esophageal reflux disease without esophagitis: Secondary | ICD-10-CM | POA: Diagnosis not present

## 2021-10-03 DIAGNOSIS — Z96659 Presence of unspecified artificial knee joint: Secondary | ICD-10-CM

## 2021-10-03 DIAGNOSIS — Z6841 Body Mass Index (BMI) 40.0 and over, adult: Secondary | ICD-10-CM | POA: Diagnosis not present

## 2021-10-03 DIAGNOSIS — Z96651 Presence of right artificial knee joint: Secondary | ICD-10-CM | POA: Diagnosis not present

## 2021-10-03 HISTORY — PX: KNEE ARTHROPLASTY: SHX992

## 2021-10-03 LAB — TSH: TSH: 0.943 u[IU]/mL (ref 0.350–4.500)

## 2021-10-03 SURGERY — ARTHROPLASTY, KNEE, TOTAL, USING IMAGELESS COMPUTER-ASSISTED NAVIGATION
Anesthesia: Spinal | Site: Knee | Laterality: Right

## 2021-10-03 MED ORDER — ONDANSETRON HCL 4 MG/2ML IJ SOLN: 4.0000 mg | Freq: Once | INTRAMUSCULAR | Status: DC | PRN

## 2021-10-03 MED ORDER — PROPOFOL 1000 MG/100ML IV EMUL
INTRAVENOUS | Status: AC
  Filled 2021-10-03: qty 100

## 2021-10-03 MED ORDER — PROPOFOL 500 MG/50ML IV EMUL
INTRAVENOUS | Status: DC | PRN
Start: 2021-10-03 — End: 2021-10-03
  Administered 2021-10-03: 150 ug/kg/min via INTRAVENOUS

## 2021-10-03 MED ORDER — PHENYLEPHRINE HCL-NACL 20-0.9 MG/250ML-% IV SOLN
INTRAVENOUS | Status: DC | PRN
  Administered 2021-10-03: 10 ug/min via INTRAVENOUS

## 2021-10-03 MED ORDER — OXYCODONE HCL 5 MG PO TABS: 5.0000 mg | ORAL_TABLET | ORAL | Status: DC | PRN

## 2021-10-03 MED ORDER — BISACODYL 10 MG RE SUPP: 10.0000 mg | Freq: Every day | RECTAL | Status: DC | PRN

## 2021-10-03 MED ORDER — TRANEXAMIC ACID-NACL 1000-0.7 MG/100ML-% IV SOLN
INTRAVENOUS | Status: AC
  Administered 2021-10-03: 1000 mg via INTRAVENOUS
  Filled 2021-10-03: qty 100

## 2021-10-03 MED ORDER — BUPIVACAINE HCL (PF) 0.5 % IJ SOLN
INTRAMUSCULAR | Status: DC | PRN
  Administered 2021-10-03: 3 mL via INTRATHECAL

## 2021-10-03 MED ORDER — CHLORHEXIDINE GLUCONATE 0.12 % MT SOLN
15.0000 mL | Freq: Once | OROMUCOSAL | Status: AC
  Administered 2021-10-03: 15 mL via OROMUCOSAL

## 2021-10-03 MED ORDER — CHLORHEXIDINE GLUCONATE 4 % EX LIQD: 60.0000 mL | Freq: Once | CUTANEOUS | Status: DC

## 2021-10-03 MED ORDER — FLEET ENEMA 7-19 GM/118ML RE ENEM: 1.0000 | ENEMA | Freq: Once | RECTAL | Status: DC | PRN

## 2021-10-03 MED ORDER — MIDAZOLAM HCL 5 MG/5ML IJ SOLN
INTRAMUSCULAR | Status: DC | PRN
  Administered 2021-10-03: 2 mg via INTRAVENOUS

## 2021-10-03 MED ORDER — ONDANSETRON HCL 4 MG/2ML IJ SOLN
INTRAMUSCULAR | Status: AC
  Filled 2021-10-03: qty 2

## 2021-10-03 MED ORDER — CELECOXIB 200 MG PO CAPS
200.0000 mg | ORAL_CAPSULE | Freq: Two times a day (BID) | ORAL | Status: DC
  Administered 2021-10-03 – 2021-10-04 (×2): 200 mg via ORAL
  Filled 2021-10-03 (×2): qty 1

## 2021-10-03 MED ORDER — CELECOXIB 200 MG PO CAPS
ORAL_CAPSULE | ORAL | Status: AC
  Filled 2021-10-03: qty 2

## 2021-10-03 MED ORDER — GABAPENTIN 300 MG PO CAPS
ORAL_CAPSULE | ORAL | Status: AC
  Filled 2021-10-03: qty 1

## 2021-10-03 MED ORDER — FERROUS SULFATE 325 (65 FE) MG PO TABS
325.0000 mg | ORAL_TABLET | Freq: Two times a day (BID) | ORAL | Status: DC
  Administered 2021-10-03 – 2021-10-04 (×2): 325 mg via ORAL
  Filled 2021-10-03 (×2): qty 1

## 2021-10-03 MED ORDER — BUPIVACAINE LIPOSOME 1.3 % IJ SUSP
INTRAMUSCULAR | Status: AC
  Filled 2021-10-03: qty 40

## 2021-10-03 MED ORDER — HYDROMORPHONE HCL 1 MG/ML IJ SOLN: 0.5000 mg | INTRAMUSCULAR | Status: DC | PRN

## 2021-10-03 MED ORDER — SODIUM CHLORIDE FLUSH 0.9 % IV SOLN
INTRAVENOUS | Status: AC
  Filled 2021-10-03: qty 80

## 2021-10-03 MED ORDER — TRANEXAMIC ACID-NACL 1000-0.7 MG/100ML-% IV SOLN
1000.0000 mg | INTRAVENOUS | Status: AC
  Administered 2021-10-03: 1000 mg via INTRAVENOUS

## 2021-10-03 MED ORDER — CHLORHEXIDINE GLUCONATE 0.12 % MT SOLN
OROMUCOSAL | Status: AC
  Filled 2021-10-03: qty 15

## 2021-10-03 MED ORDER — CELECOXIB 200 MG PO CAPS
400.0000 mg | ORAL_CAPSULE | Freq: Once | ORAL | Status: AC
  Administered 2021-10-03: 400 mg via ORAL

## 2021-10-03 MED ORDER — FENTANYL CITRATE (PF) 100 MCG/2ML IJ SOLN: 25.0000 ug | INTRAMUSCULAR | Status: DC | PRN

## 2021-10-03 MED ORDER — DIPHENHYDRAMINE HCL 12.5 MG/5ML PO ELIX: 12.5000 mg | ORAL_SOLUTION | ORAL | Status: DC | PRN

## 2021-10-03 MED ORDER — MENTHOL 3 MG MT LOZG
1.0000 | LOZENGE | OROMUCOSAL | Status: DC | PRN
  Filled 2021-10-03: qty 9

## 2021-10-03 MED ORDER — MAGNESIUM HYDROXIDE 400 MG/5ML PO SUSP
30.0000 mL | Freq: Every day | ORAL | Status: DC
  Administered 2021-10-04: 30 mL via ORAL
  Filled 2021-10-03 (×2): qty 30

## 2021-10-03 MED ORDER — DEXAMETHASONE SODIUM PHOSPHATE 10 MG/ML IJ SOLN
INTRAMUSCULAR | Status: AC
  Administered 2021-10-03: 8 mg via INTRAVENOUS
  Filled 2021-10-03: qty 1

## 2021-10-03 MED ORDER — GLYCOPYRROLATE 0.2 MG/ML IJ SOLN
INTRAMUSCULAR | Status: DC | PRN
  Administered 2021-10-03: .2 mg via INTRAVENOUS

## 2021-10-03 MED ORDER — ENSURE PRE-SURGERY PO LIQD
296.0000 mL | Freq: Once | ORAL | Status: AC
  Administered 2021-10-03: 296 mL via ORAL
  Filled 2021-10-03: qty 296

## 2021-10-03 MED ORDER — ONDANSETRON HCL 4 MG/2ML IJ SOLN: 4.0000 mg | Freq: Four times a day (QID) | INTRAMUSCULAR | Status: DC | PRN

## 2021-10-03 MED ORDER — LIDOCAINE HCL (PF) 2 % IJ SOLN
INTRAMUSCULAR | Status: AC
  Filled 2021-10-03: qty 5

## 2021-10-03 MED ORDER — SODIUM CHLORIDE (PF) 0.9 % IJ SOLN
INTRAMUSCULAR | Status: DC | PRN
  Administered 2021-10-03: 120 mL via INTRAMUSCULAR

## 2021-10-03 MED ORDER — SODIUM CHLORIDE 0.9 % IV SOLN: INTRAVENOUS | Status: DC

## 2021-10-03 MED ORDER — ONDANSETRON HCL 4 MG/2ML IJ SOLN
INTRAMUSCULAR | Status: DC | PRN
Start: 2021-10-03 — End: 2021-10-03
  Administered 2021-10-03: 4 mg via INTRAVENOUS

## 2021-10-03 MED ORDER — ACETAMINOPHEN 10 MG/ML IV SOLN
1000.0000 mg | Freq: Four times a day (QID) | INTRAVENOUS | Status: DC
  Administered 2021-10-03 – 2021-10-04 (×3): 1000 mg via INTRAVENOUS
  Filled 2021-10-03 (×3): qty 100

## 2021-10-03 MED ORDER — SENNOSIDES-DOCUSATE SODIUM 8.6-50 MG PO TABS
1.0000 | ORAL_TABLET | Freq: Two times a day (BID) | ORAL | Status: DC
  Administered 2021-10-03 – 2021-10-04 (×2): 1 via ORAL
  Filled 2021-10-03 (×2): qty 1

## 2021-10-03 MED ORDER — PANTOPRAZOLE SODIUM 40 MG PO TBEC
40.0000 mg | DELAYED_RELEASE_TABLET | Freq: Two times a day (BID) | ORAL | Status: DC
  Administered 2021-10-03 – 2021-10-04 (×2): 40 mg via ORAL
  Filled 2021-10-03 (×2): qty 1

## 2021-10-03 MED ORDER — ONDANSETRON HCL 4 MG PO TABS: 4.0000 mg | ORAL_TABLET | Freq: Four times a day (QID) | ORAL | Status: DC | PRN

## 2021-10-03 MED ORDER — CEFAZOLIN SODIUM-DEXTROSE 2-4 GM/100ML-% IV SOLN
2.0000 g | Freq: Four times a day (QID) | INTRAVENOUS | Status: AC
  Administered 2021-10-03: 2 g via INTRAVENOUS
  Filled 2021-10-03: qty 100

## 2021-10-03 MED ORDER — GABAPENTIN 300 MG PO CAPS
300.0000 mg | ORAL_CAPSULE | Freq: Once | ORAL | Status: AC
  Administered 2021-10-03: 300 mg via ORAL

## 2021-10-03 MED ORDER — OXYCODONE HCL 5 MG PO TABS: 10.0000 mg | ORAL_TABLET | ORAL | Status: DC | PRN

## 2021-10-03 MED ORDER — ACETAMINOPHEN 325 MG PO TABS: 325.0000 mg | ORAL_TABLET | Freq: Four times a day (QID) | ORAL | Status: DC | PRN

## 2021-10-03 MED ORDER — PHENOL 1.4 % MT LIQD
1.0000 | OROMUCOSAL | Status: DC | PRN
  Filled 2021-10-03: qty 177

## 2021-10-03 MED ORDER — ENOXAPARIN SODIUM 30 MG/0.3ML IJ SOSY
30.0000 mg | PREFILLED_SYRINGE | Freq: Two times a day (BID) | INTRAMUSCULAR | Status: DC
  Administered 2021-10-04: 30 mg via SUBCUTANEOUS
  Filled 2021-10-03: qty 0.3

## 2021-10-03 MED ORDER — ACETAMINOPHEN 10 MG/ML IV SOLN
INTRAVENOUS | Status: AC
  Filled 2021-10-03: qty 100

## 2021-10-03 MED ORDER — SODIUM CHLORIDE 0.9 % IR SOLN
Status: DC | PRN
  Administered 2021-10-03: 3000 mL

## 2021-10-03 MED ORDER — 0.9 % SODIUM CHLORIDE (POUR BTL) OPTIME
TOPICAL | Status: DC | PRN
  Administered 2021-10-03: 500 mL

## 2021-10-03 MED ORDER — ALUM & MAG HYDROXIDE-SIMETH 200-200-20 MG/5ML PO SUSP: 30.0000 mL | ORAL | Status: DC | PRN

## 2021-10-03 MED ORDER — BUPIVACAINE HCL (PF) 0.25 % IJ SOLN
INTRAMUSCULAR | Status: AC
  Filled 2021-10-03: qty 120

## 2021-10-03 MED ORDER — ACETAMINOPHEN 10 MG/ML IV SOLN
INTRAVENOUS | Status: DC | PRN
Start: 2021-10-03 — End: 2021-10-03
  Administered 2021-10-03: 1000 mg via INTRAVENOUS

## 2021-10-03 MED ORDER — TRANEXAMIC ACID-NACL 1000-0.7 MG/100ML-% IV SOLN
INTRAVENOUS | Status: AC
  Filled 2021-10-03: qty 100

## 2021-10-03 MED ORDER — PHENYLEPHRINE HCL-NACL 20-0.9 MG/250ML-% IV SOLN
INTRAVENOUS | Status: AC
  Filled 2021-10-03: qty 250

## 2021-10-03 MED ORDER — DEXAMETHASONE SODIUM PHOSPHATE 10 MG/ML IJ SOLN: 8.0000 mg | Freq: Once | INTRAMUSCULAR | Status: AC

## 2021-10-03 MED ORDER — LACTATED RINGERS IV SOLN: INTRAVENOUS | Status: DC

## 2021-10-03 MED ORDER — TRANEXAMIC ACID-NACL 1000-0.7 MG/100ML-% IV SOLN: 1000.0000 mg | Freq: Once | INTRAVENOUS | Status: AC

## 2021-10-03 MED ORDER — TRAMADOL HCL 50 MG PO TABS: 50.0000 mg | ORAL_TABLET | ORAL | Status: DC | PRN

## 2021-10-03 MED ORDER — GLYCOPYRROLATE 0.2 MG/ML IJ SOLN
INTRAMUSCULAR | Status: AC
  Filled 2021-10-03: qty 1

## 2021-10-03 MED ORDER — METOCLOPRAMIDE HCL 10 MG PO TABS
10.0000 mg | ORAL_TABLET | Freq: Three times a day (TID) | ORAL | Status: DC
  Administered 2021-10-03 – 2021-10-04 (×3): 10 mg via ORAL
  Filled 2021-10-03 (×3): qty 1

## 2021-10-03 MED ORDER — CEFAZOLIN SODIUM-DEXTROSE 2-4 GM/100ML-% IV SOLN
INTRAVENOUS | Status: AC
  Filled 2021-10-03: qty 100

## 2021-10-03 MED ORDER — CARVEDILOL 12.5 MG PO TABS
12.5000 mg | ORAL_TABLET | Freq: Two times a day (BID) | ORAL | Status: DC
  Administered 2021-10-03 – 2021-10-04 (×2): 12.5 mg via ORAL
  Filled 2021-10-03 (×2): qty 1

## 2021-10-03 MED ORDER — MIDAZOLAM HCL 2 MG/2ML IJ SOLN
INTRAMUSCULAR | Status: AC
  Filled 2021-10-03: qty 2

## 2021-10-03 MED ORDER — CEFAZOLIN SODIUM-DEXTROSE 2-4 GM/100ML-% IV SOLN
2.0000 g | INTRAVENOUS | Status: AC
  Administered 2021-10-03: 2 g via INTRAVENOUS

## 2021-10-03 MED ORDER — ORAL CARE MOUTH RINSE: 15.0000 mL | Freq: Once | OROMUCOSAL | Status: AC

## 2021-10-03 MED ORDER — PRONTOSAN WOUND IRRIGATION OPTIME
TOPICAL | Status: DC | PRN
  Administered 2021-10-03: 1

## 2021-10-03 SURGICAL SUPPLY — 84 items
ATTUNE MED DOME PAT 38 KNEE (Knees) ×1 IMPLANT
ATTUNE PSFEM RTSZ5 NARCEM KNEE (Femur) ×1 IMPLANT
ATTUNE PSRP INSE SZ5 7 KNEE (Insert) ×1 IMPLANT
BASE TIBIAL ROT PLAT SZ 5 KNEE (Knees) IMPLANT
BATTERY INSTRU NAVIGATION (MISCELLANEOUS) ×8 IMPLANT
BLADE SAW 70X12.5 (BLADE) ×2 IMPLANT
BLADE SAW 90X13X1.19 OSCILLAT (BLADE) ×2 IMPLANT
BLADE SAW 90X25X1.19 OSCILLAT (BLADE) ×2 IMPLANT
BONE CEMENT GENTAMICIN (Cement) ×4 IMPLANT
BOWL CEMENT MIXING ADV NOZZLE (MISCELLANEOUS) ×1 IMPLANT
CANISTER WOUND CARE 500ML ATS (WOUND CARE) ×1 IMPLANT
CEMENT BONE GENTAMICIN 40 (Cement) IMPLANT
COOLER POLAR GLACIER W/PUMP (MISCELLANEOUS) ×2 IMPLANT
CUFF TOURN SGL QUICK 24 (TOURNIQUET CUFF)
CUFF TOURN SGL QUICK 34 (TOURNIQUET CUFF)
CUFF TRNQT CYL 24X4X16.5-23 (TOURNIQUET CUFF) IMPLANT
CUFF TRNQT CYL 34X4.125X (TOURNIQUET CUFF) IMPLANT
DRAPE 3/4 80X56 (DRAPES) ×2 IMPLANT
DRAPE INCISE IOBAN 66X45 STRL (DRAPES) ×2 IMPLANT
DRSG DERMACEA 8X12 NADH (GAUZE/BANDAGES/DRESSINGS) ×1 IMPLANT
DRSG MEPILEX SACRM 8.7X9.8 (GAUZE/BANDAGES/DRESSINGS) ×2 IMPLANT
DRSG OPSITE POSTOP 4X14 (GAUZE/BANDAGES/DRESSINGS) ×1 IMPLANT
DRSG TEGADERM 4X4.75 (GAUZE/BANDAGES/DRESSINGS) ×1 IMPLANT
DURAPREP 26ML APPLICATOR (WOUND CARE) ×4 IMPLANT
ELECT BLADE 6.5 EXT (BLADE) ×1 IMPLANT
ELECT CAUTERY BLADE 6.4 (BLADE) ×2 IMPLANT
ELECT REM PT RETURN 9FT ADLT (ELECTROSURGICAL) ×2
ELECTRODE REM PT RTRN 9FT ADLT (ELECTROSURGICAL) ×1 IMPLANT
EX-PIN ORTHOLOCK NAV 4X150 (PIN) ×4 IMPLANT
GLOVE SRG 8 PF TXTR STRL LF DI (GLOVE) ×1 IMPLANT
GLOVE SURG ENC TEXT LTX SZ7.5 (GLOVE) ×8 IMPLANT
GLOVE SURG UNDER POLY LF SZ7.5 (GLOVE) ×2 IMPLANT
GLOVE SURG UNDER POLY LF SZ8 (GLOVE) ×1
GOWN STRL REUS W/ TWL LRG LVL3 (GOWN DISPOSABLE) ×2 IMPLANT
GOWN STRL REUS W/ TWL XL LVL3 (GOWN DISPOSABLE) ×1 IMPLANT
GOWN STRL REUS W/TWL LRG LVL3 (GOWN DISPOSABLE) ×2
GOWN STRL REUS W/TWL XL LVL3 (GOWN DISPOSABLE) ×1
HEMOVAC 400CC 10FR (MISCELLANEOUS) ×1 IMPLANT
HOLDER FOLEY CATH W/STRAP (MISCELLANEOUS) ×2 IMPLANT
HOLSTER ELECTROSUGICAL PENCIL (MISCELLANEOUS) ×1 IMPLANT
IV NS IRRIG 3000ML ARTHROMATIC (IV SOLUTION) ×2 IMPLANT
KIT PREVENA INCISION MGT20CM45 (CANNISTER) ×1 IMPLANT
KIT TURNOVER KIT A (KITS) ×2 IMPLANT
KNIFE SCULPS 14X20 (INSTRUMENTS) ×2 IMPLANT
LABEL OR SOLS (LABEL) ×1 IMPLANT
MANIFOLD NEPTUNE II (INSTRUMENTS) ×4 IMPLANT
NDL SAFETY ECLIPSE 18X1.5 (NEEDLE) ×1 IMPLANT
NDL SPNL 20GX3.5 QUINCKE YW (NEEDLE) ×2 IMPLANT
NEEDLE HYPO 18GX1.5 SHARP (NEEDLE)
NEEDLE SPNL 20GX3.5 QUINCKE YW (NEEDLE) ×4 IMPLANT
NS IRRIG 500ML POUR BTL (IV SOLUTION) ×2 IMPLANT
PACK TOTAL KNEE (MISCELLANEOUS) ×2 IMPLANT
PAD ABD DERMACEA PRESS 5X9 (GAUZE/BANDAGES/DRESSINGS) ×4 IMPLANT
PAD WRAPON POLAR KNEE (MISCELLANEOUS) ×1 IMPLANT
PAD WRAPON POLOR MULTI XL (MISCELLANEOUS) IMPLANT
PENCIL SMOKE EVACUATOR COATED (MISCELLANEOUS) ×2 IMPLANT
PIN DRILL FIX HALF THREAD (BIT) ×4 IMPLANT
PIN FIXATION 1/8DIA X 3INL (PIN) ×2 IMPLANT
PULSAVAC PLUS IRRIG FAN TIP (DISPOSABLE) ×2
SOL PREP PVP 2OZ (MISCELLANEOUS) ×2
SOLUTION PREP PVP 2OZ (MISCELLANEOUS) ×1 IMPLANT
SOLUTION PRONTOSAN WOUND 350ML (IRRIGATION / IRRIGATOR) ×2 IMPLANT
SPONGE DRAIN TRACH 4X4 STRL 2S (GAUZE/BANDAGES/DRESSINGS) ×1 IMPLANT
SPONGE T-LAP 18X18 ~~LOC~~+RFID (SPONGE) ×1 IMPLANT
STAPLER SKIN PROX 35W (STAPLE) ×2 IMPLANT
STOCKINETTE IMPERV 14X48 (MISCELLANEOUS) ×1 IMPLANT
STRAP TIBIA SHORT (MISCELLANEOUS) ×2 IMPLANT
SUCTION FRAZIER HANDLE 10FR (MISCELLANEOUS) ×1
SUCTION TUBE FRAZIER 10FR DISP (MISCELLANEOUS) ×1 IMPLANT
SUT VIC AB 0 CT1 36 (SUTURE) ×4 IMPLANT
SUT VIC AB 1 CT1 36 (SUTURE) ×4 IMPLANT
SUT VIC AB 2-0 CT2 27 (SUTURE) ×2 IMPLANT
SYR 20ML LL LF (SYRINGE) ×1 IMPLANT
SYR 30ML LL (SYRINGE) ×4 IMPLANT
TIBIAL BASE ROT PLAT SZ 5 KNEE (Knees) ×2 IMPLANT
TIP FAN IRRIG PULSAVAC PLUS (DISPOSABLE) ×1 IMPLANT
TOWEL OR 17X26 4PK STRL BLUE (TOWEL DISPOSABLE) ×2 IMPLANT
TOWER CARTRIDGE SMART MIX (DISPOSABLE) ×1 IMPLANT
TRAY FOLEY MTR SLVR 16FR STAT (SET/KITS/TRAYS/PACK) ×2 IMPLANT
WATER STERILE IRR 1000ML POUR (IV SOLUTION) ×2 IMPLANT
WATER STERILE IRR 500ML POUR (IV SOLUTION) ×1 IMPLANT
WRAP-ON POLOR PAD MULTI XL (MISCELLANEOUS) ×1
WRAPON POLAR PAD KNEE (MISCELLANEOUS)
WRAPON POLOR PAD MULTI XL (MISCELLANEOUS) ×1

## 2021-10-03 NOTE — Progress Notes (Signed)
?   10/03/21 0700  ?Clinical Encounter Type  ?Visited With Patient  ?Visit Type Initial;Pre-op  ?Spiritual Encounters  ?Spiritual Needs Prayer  ? ?Chaplain provided pre-op support to patient through compassionate presence, meaningful conversation and prayer. ?

## 2021-10-03 NOTE — Evaluation (Signed)
Physical Therapy Evaluation Patient Details Name: Meredith Reyes MRN: [id number] DOB: 03-11-1956 Today's Date: 10/03/2021  History of Present Illness  Pt is a 66 y.o. female s/p elective R TKA.  Clinical Impression  Pt admitted with above diagnosis. Pt received supine in bed agreeable to PT services. Has normal sensation in RLE, able to perform quad set and SLR on RLE without lag. Pt able to report PLOF, DME, assist level that can be provided at home, and home lay out. At baseline pt is mod-I with RW for household distances and indep with BSC and sponge bathing. Pt relies on family for cooking and household maintenance but is otherwise independent/Mod-I with ADL's mentioned above. Pt is mod-I with bed mobility with HOB elevated without signs/symptoms of orthostasis. Pt requires bed elevated due to lack of UE support for leverage to attain standing thus requiring minA from PT to stand. Pt able to stand step transfer with RW with excellent sequencing of LE's with RW and safe hand placement to descend into recliner. Although recliner is lower, pt able to stand with minguard due to hand rails. Pt left in recliner with all needs in place, R knee in extension. Anticipate pending progression of gait, pt will be safe to d/c home with [facility] PT services. RN aware of current assist levels needed with various transfers and pt location. Pt currently with functional limitations due to the deficits listed below (see PT Problem List). Pt will benefit from skilled PT to increase their independence and safety with mobility to allow discharge to the venue listed below.      Recommendations for follow up therapy are one component of a multi-disciplinary discharge planning process, led by the attending physician.  Recommendations may be updated based on patient status, additional functional criteria and insurance authorization.  Follow Up Recommendations Home health PT    Assistance Recommended at Discharge Intermittent  Supervision/Assistance  Patient can return home with the following  A little help with walking and/or transfers;A little help with bathing/dressing/bathroom;Assistance with cooking/housework;Assist for transportation;Help with stairs or ramp for entrance    Equipment Recommendations None recommended by PT  Recommendations for Other Services       Functional Status Assessment Patient has had a recent decline in their functional status and demonstrates the ability to make significant improvements in function in a reasonable and predictable amount of time.     Precautions / Restrictions Precautions Precautions: Knee;Fall Precaution Booklet Issued: No Restrictions Weight Bearing Restrictions: Yes RLE Weight Bearing: Weight bearing as tolerated      Mobility  Bed Mobility Overal bed mobility: Modified Independent             General bed mobility comments: use of bed features (HOB elevated) Patient Response: Cooperative  Transfers Overall transfer level: Needs assistance Equipment used: Rolling walker (2 wheels) Transfers: Sit to/from Stand, Bed to chair/wheelchair/BSC Sit to Stand: From elevated surface, Min guard, Min assist   Step pivot transfers: Supervision       General transfer comment: Fromed elevated bed, minA+1 due to lack of UE support. ABle to stand from recliner with UE's on chair rails with minguard    Ambulation/Gait               General Gait Details: deferred  Stairs            Wheelchair Mobility    Modified Rankin (Stroke Patients Only)       Balance Overall balance assessment: Needs assistance Sitting-balance support: Bilateral upper extremity  supported, Feet supported Sitting balance-Leahy Scale: Fair     Standing balance support: Bilateral upper extremity supported, During functional activity Standing balance-Leahy Scale: Fair Standing balance comment: Requiring RW with heavy UE assist initially                              Pertinent Vitals/Pain Pain Assessment Pain Assessment: Faces Faces Pain Scale: No hurt Pain Intervention(s): Ice applied    Home Living Family/patient expects to be discharged to:: Private residence Living Arrangements: Children;Other relatives (grandson primarily) Available Help at Discharge: Family;Available 24 hours/day Type of Home: House Home Access: Ramped entrance       Home Layout: One level Home Equipment: Agricultural consultant (2 wheels);BSC/3in1;Shower seat;Other (comment) (bed that has features that elevate/recline)      Prior Function Prior Level of Function : Independent/Modified Independent             Mobility Comments: use of RW at baseline ADLs Comments: Grand son/family assists with meals and home tasks. Otherwise independent     Hand Dominance        Extremity/Trunk Assessment   Upper Extremity Assessment Upper Extremity Assessment: Defer to OT evaluation    Lower Extremity Assessment Lower Extremity Assessment: Generalized weakness;RLE deficits/detail RLE Deficits / Details: R TKA RLE Sensation: WNL    Cervical / Trunk Assessment Cervical / Trunk Assessment: Normal  Communication   Communication: No difficulties  Cognition Arousal/Alertness: Awake/alert Behavior During Therapy: WFL for tasks assessed/performed Overall Cognitive Status: Within Functional Limits for tasks assessed                                 General Comments: Pleasant and cooperative throughout.        General Comments      Exercises Total Joint Exercises Ankle Circles/Pumps: AROM, Strengthening, Both, 20 reps, Supine Quad Sets: AROM, Strengthening, Right, 10 reps, Supine Gluteal Sets: AROM, Strengthening, Both, 10 reps, Supine Hip ABduction/ADduction: AROM, Strengthening, Right, 10 reps Straight Leg Raises: AROM, Right, 5 reps, Supine Marching in Standing: AROM, Strengthening, Both, 5 reps, Standing Other Exercises Other Exercises:  Role of PT in acute setting, DME and D/c recs, WB'ing status. Introduced LE therex.   Assessment/Plan    PT Assessment Patient needs continued PT services  PT Problem List Decreased strength;Decreased mobility;Decreased safety awareness;Decreased range of motion;Obesity;Decreased activity tolerance;Pain;Decreased knowledge of use of DME;Decreased balance       PT Treatment Interventions DME instruction;Therapeutic exercise;Gait training;Balance training;Stair training;Neuromuscular re-education;Functional mobility training;Therapeutic activities;Patient/family education    PT Goals (Current goals can be found in the Care Plan section)  Acute Rehab PT Goals Patient Stated Goal: to return home, becoming more active PT Goal Formulation: With patient Time For Goal Achievement: 10/17/21 Potential to Achieve Goals: Good    Frequency BID     Co-evaluation               AM-PAC PT "6 Clicks" Mobility  Outcome Measure Help needed turning from your back to your side while in a flat bed without using bedrails?: A Little Help needed moving from lying on your back to sitting on the side of a flat bed without using bedrails?: A Little Help needed moving to and from a bed to a chair (including a wheelchair)?: A Little Help needed standing up from a chair using your arms (e.g., wheelchair or bedside chair)?: A Little Help needed to  walk in hospital room?: A Little Help needed climbing 3-5 steps with a railing? : A Lot 6 Click Score: 17    End of Session Equipment Utilized During Treatment: Gait belt Activity Tolerance: Patient tolerated treatment well Patient left: in chair;with call bell/phone within reach;with chair alarm set;with SCD's reapplied Nurse Communication: Mobility status PT Visit Diagnosis: Other abnormalities of gait and mobility (R26.89);Muscle weakness (generalized) (M62.81);Difficulty in walking, not elsewhere classified (R26.2)    Time: 4166-0630 PT Time Calculation  (min) (ACUTE ONLY): 27 min   Charges:   PT Evaluation $PT Eval Low Complexity: 1 Low PT Treatments $Therapeutic Exercise: 8-22 mins        Topanga Alvelo M. Fairly IV, PT, DPT Physical Therapist- [place]  [facility]  10/03/2021, 3:53 PM

## 2021-10-03 NOTE — Transfer of Care (Signed)
Immediate Anesthesia Transfer of Care Note ? ?Patient: Meredith Reyes ? ?Procedure(s) Performed: COMPUTER ASSISTED TOTAL KNEE ARTHROPLASTY - RNFA (Right: Knee) ? ?Patient Location: PACU ? ?Anesthesia Type:Spinal ? ?Level of Consciousness: awake and patient cooperative ? ?Airway & Oxygen Therapy: Patient Spontanous Breathing ? ?Post-op Assessment: Report given to RN and Post -op Vital signs reviewed and stable ? ?Post vital signs: Reviewed and stable ? ?Last Vitals:  ?Vitals Value Taken Time  ?BP 107/55 10/03/21 1255  ?Temp    ?Pulse 68 10/03/21 1257  ?Resp 19 10/03/21 1257  ?SpO2 95 % 10/03/21 1257  ?Vitals shown include unvalidated device data. ? ?Last Pain:  ?Vitals:  ? 10/03/21 0633  ?TempSrc: Oral  ?PainSc: 0-No pain  ?   ? ?  ? ?Complications: No notable events documented. ?

## 2021-10-03 NOTE — Anesthesia Procedure Notes (Signed)
Spinal ? ?Patient location during procedure: OR ?Start time: 10/03/2021 8:20 AM ?End time: 10/03/2021 8:33 AM ?Reason for block: surgical anesthesia ?Staffing ?Performed: anesthesiologist and resident/CRNA  ?Anesthesiologist: Martha Clan, MD ?Resident/CRNA: Jerrye Noble, CRNA ?Preanesthetic Checklist ?Completed: patient identified, IV checked, site marked, risks and benefits discussed, surgical consent, monitors and equipment checked, pre-op evaluation and timeout performed ?Spinal Block ?Patient position: sitting ?Prep: ChloraPrep ?Patient monitoring: heart rate, continuous pulse ox, blood pressure and cardiac monitor ?Approach: midline ?Location: L2-3 ?Injection technique: single-shot ?Needle ?Needle type: Whitacre and Introducer  ?Needle gauge: 24 G ?Needle length: 9 cm ?Assessment ?Sensory level: T10 ?Events: CSF return ?Additional Notes ?Sterile aseptic technique used throughout the procedure.  Negative paresthesia. Negative blood return. Positive free-flowing CSF. Expiration date of kit checked and confirmed. Patient tolerated procedure well, without complications. ? ? ? ? ? ?

## 2021-10-03 NOTE — TOC Progression Note (Signed)
Transition of Care (TOC) - Progression Note  ? ? ?Patient Details  ?Name: Meredith Reyes ?MRN: [id number] ?Date of Birth: 11-23-1955 ? ?Transition of Care (TOC) CM/SW Contact  ?Marlowe Sax, RN ?Phone Number: ?10/03/2021, 2:39 PM ? ?Clinical Narrative:   [facility] is set up with the patient for [facility] services prior to Admission, PT to eval, TOC to assist with DC plan and needs ? ? ? ?  ?  ? ?Expected Discharge Plan and Services ?  ?  ?  ?  ?  ?                ?  ?  ?  ?  ?  ?  ?  ?  ?  ?  ? ? ?Social Determinants of Health (SDOH) Interventions ?  ? ?Readmission Risk Interventions ?No flowsheet data found. ? ?

## 2021-10-03 NOTE — H&P (Signed)
The patient has been re-examined, and the chart reviewed, and there have been no interval changes to the documented history and physical.    The risks, benefits, and alternatives have been discussed at length. The patient expressed understanding of the risks benefits and agreed with plans for surgical intervention.  Journei Thomassen P. Katricia Prehn, Jr. M.D.    

## 2021-10-03 NOTE — Anesthesia Preprocedure Evaluation (Signed)
Anesthesia Evaluation  ?Patient identified by MRN, date of birth, ID band ?Patient awake ? ? ? ?Reviewed: ?Allergy & Precautions, H&P , NPO status , reviewed documented beta blocker date and time  ? ?History of Anesthesia Complications ?Negative for: history of anesthetic complications ? ?Airway ?Mallampati: IV ? ?TM Distance: >3 FB ?Neck ROM: full ? ? ? Dental ? ?(+) Teeth Intact, Dental Advidsory Given ?  ?Pulmonary ?neg pulmonary ROS, neg sleep apnea,  ?  ?Pulmonary exam normal ? ? ? ? ? ? ? Cardiovascular ?Exercise Tolerance: Good ?hypertension, (-) angina(-) Past MI and (-) Cardiac Stents + dysrhythmias Atrial Fibrillation (-) Valvular Problems/Murmurs ? ? ?  ?Neuro/Psych ?negative neurological ROS ? negative psych ROS  ? GI/Hepatic ?Neg liver ROS, GERD  Controlled,  ?Endo/Other  ?neg diabetesMorbid obesity ? Renal/GU ?negative Renal ROS  ? ?  ?Musculoskeletal ? ? Abdominal ?  ?Peds ? Hematology ?  ?Anesthesia Other Findings ?Past Medical History: ?No date: GERD (gastroesophageal reflux disease) ?No date: Hypertension ? ?Past Surgical History: ?No date: ABDOMINAL HYSTERECTOMY ?No date: BACK SURGERY ?No date: nodule on back ? ?BMI   ? Body Mass Index: 47.13 kg/m?  ?  ? ? Reproductive/Obstetrics ? ?  ? ? ? ? ? ? ? ? ? ? ? ? ? ?  ?  ? ? ? ? ? ? ? ? ?Anesthesia Physical ? ?Anesthesia Plan ? ?ASA: 3 ? ?Anesthesia Plan: Spinal  ? ?Post-op Pain Management:   ? ?Induction: Intravenous ? ?PONV Risk Score and Plan: 2 and Treatment may vary due to age or medical condition, TIVA and Propofol infusion ? ?Airway Management Planned: Natural Airway and Simple Face Mask ? ?Additional Equipment:  ? ?Intra-op Plan:  ? ?Post-operative Plan:  ? ?Informed Consent: I have reviewed the patients History and Physical, chart, labs and discussed the procedure including the risks, benefits and alternatives for the proposed anesthesia with the patient or authorized representative who has indicated his/her  understanding and acceptance.  ? ? ? ?Dental Advisory Given ? ?Plan Discussed with: CRNA ? ?Anesthesia Plan Comments:   ? ? ? ? ? ? ?Anesthesia Quick Evaluation ? ?

## 2021-10-03 NOTE — Op Note (Addendum)
OPERATIVE NOTE ? ?DATE OF SURGERY:  10/03/2021 ? ?PATIENT NAME:  Meredith Reyes   ?DOB: Nov 09, 1955  ?MRN: [id number] ? ?PRE-OPERATIVE DIAGNOSIS: Degenerative arthrosis of the right knee, primary ? ?POST-OPERATIVE DIAGNOSIS:  Same ? ?PROCEDURE:  Right total knee arthroplasty using computer-assisted navigation ? ?SURGEON:  Jena Gauss. M.D. ? ?ANESTHESIA: spinal ? ?ESTIMATED BLOOD LOSS: 50 mL ? ?FLUIDS REPLACED: 1500 mL of crystalloid ? ?TOURNIQUET TIME: 125 minutes ? ?DRAINS: Prevena wound VAC ? ?SOFT TISSUE RELEASES: Anterior cruciate ligament, posterior cruciate ligament, deep and superficial medial collateral ligament, patellofemoral ligament ? ?IMPLANTS UTILIZED: DePuy Attune size 5N posterior stabilized femoral component (cemented), size 5 rotating platform tibial component (cemented), 38 mm medialized dome patella (cemented), and a 7 mm stabilized rotating platform polyethylene insert. ? ?INDICATIONS FOR SURGERY: Meredith Reyes is a 66 y.o. year old female with a long history of progressive knee pain. X-rays demonstrated severe degenerative changes in tricompartmental fashion. The patient had not seen any significant improvement despite conservative nonsurgical intervention. After discussion of the risks and benefits of surgical intervention, the patient expressed understanding of the risks benefits and agree with plans for total knee arthroplasty.  ? ?The risks, benefits, and alternatives were discussed at length including but not limited to the risks of infection, bleeding, nerve injury, stiffness, blood clots, the need for revision surgery, cardiopulmonary complications, among others, and they were willing to proceed. ? ?PROCEDURE IN DETAIL: The patient was brought into the operating room and, after adequate spinal anesthesia was achieved, a tourniquet was placed on the patient's upper thigh. The patient's knee and leg were cleaned and prepped with alcohol and DuraPrep and draped in the usual sterile  fashion. A "timeout" was performed as per usual protocol. The lower extremity was exsanguinated using an Esmarch, and the tourniquet was inflated to 300 mmHg. An anterior longitudinal incision was made followed by a standard mid vastus approach. The deep fibers of the medial collateral ligament were elevated in a subperiosteal fashion off of the medial flare of the tibia so as to maintain a continuous soft tissue sleeve. The patella was subluxed laterally and the patellofemoral ligament was incised. Inspection of the knee demonstrated severe degenerative changes with full-thickness loss of articular cartilage. Osteophytes were debrided using a rongeur. Anterior and posterior cruciate ligaments were excised. Two 4.0 mm Schanz pins were inserted in the femur and into the tibia for attachment of the array of trackers used for computer-assisted navigation. Hip center was identified using a circumduction technique. Distal landmarks were mapped using the computer. The distal femur and proximal tibia were mapped using the computer. The distal femoral cutting guide was positioned using computer-assisted navigation so as to achieve a 5? distal valgus cut. The femur was sized and it was felt that a size 5N femoral component was appropriate. A size 5 femoral cutting guide was positioned and the anterior cut was performed and verified using the computer. This was followed by completion of the posterior and chamfer cuts. Femoral cutting guide for the central box was then positioned in the center box cut was performed. ? ?Attention was then directed to the proximal tibia. Medial and lateral menisci were excised. The extramedullary tibial cutting guide was positioned using computer-assisted navigation so as to achieve a 0? varus-valgus alignment and 3? posterior slope. The cut was performed and verified using the computer. The proximal tibia was sized and it was felt that a size 7 tibial tray was appropriate. Tibial and femoral  trials were inserted  followed by insertion of a 5 mm polyethylene insert. The knee was felt to be tight medially. A Cobb elevator was used to elevate the superficial fibers of the medial collateral ligament.  This allowed for excellent mediolateral soft tissue balancing both in flexion and in full extension. Finally, the patella was cut and prepared so as to accommodate a 38 mm medialized dome patella. A patella trial was placed and the knee was placed through a range of motion with excellent patellar tracking appreciated. The femoral trial was removed after debridement of posterior osteophytes. The central post-hole for the tibial component was reamed followed by insertion of a keel punch. Tibial trials were then removed. Cut surfaces of bone were irrigated with copious amounts of normal saline using pulsatile lavage and then suctioned dry. Polymethylmethacrylate cement with gentamicin was prepared in the usual fashion using a vacuum mixer. Cement was applied to the cut surface of the proximal tibia as well as along the undersurface of a size 5 rotating platform tibial component. Tibial component was positioned and impacted into place. Excess cement was removed using Personal assistant. Cement was then applied to the cut surfaces of the femur as well as along the posterior flanges of the size 5N femoral component. The femoral component was positioned and impacted into place. Excess cement was removed using Personal assistant. A 7 mm polyethylene trial was inserted and the knee was brought into full extension with steady axial compression applied. Finally, cement was applied to the backside of a 38 mm medialized dome patella and the patellar component was positioned and patellar clamp applied. Excess cement was removed using Personal assistant. After adequate curing of the cement, the tourniquet was deflated after a total tourniquet time of 125 minutes. Hemostasis was achieved using electrocautery. The knee was irrigated  with copious amounts of normal saline using pulsatile lavage followed by 350 ml of Prontosan and then suctioned dry. 20 mL of 1.3% Exparel and 60 mL of 0.25% Marcaine in 40 mL of normal saline was injected along the posterior capsule, medial and lateral gutters, and along the arthrotomy site. A 7 mm stabilized rotating platform polyethylene insert was inserted and the knee was placed through a range of motion with excellent mediolateral soft tissue balancing appreciated and excellent patellar tracking noted. The medial parapatellar portion of the incision was reapproximated using interrupted sutures of #1 Vicryl. Subcutaneous tissue was approximated in layers using first #0 Vicryl followed #2-0 Vicryl. The skin was approximated with skin staples.  A 20 cm Prevena wound VAC was applied followed by application of a sterile dressing was applied. ? ?The patient tolerated the procedure well and was transported to the recovery room in stable condition.   ? ?Samyukta Cura P. Angie Fava., M.D.  ?

## 2021-10-04 DIAGNOSIS — M1711 Unilateral primary osteoarthritis, right knee: Secondary | ICD-10-CM | POA: Diagnosis not present

## 2021-10-04 DIAGNOSIS — Z6841 Body Mass Index (BMI) 40.0 and over, adult: Secondary | ICD-10-CM | POA: Diagnosis not present

## 2021-10-04 DIAGNOSIS — I10 Essential (primary) hypertension: Secondary | ICD-10-CM | POA: Diagnosis not present

## 2021-10-04 DIAGNOSIS — I4819 Other persistent atrial fibrillation: Secondary | ICD-10-CM | POA: Diagnosis not present

## 2021-10-04 MED ORDER — OXYCODONE HCL 5 MG PO TABS: 5.0000 mg | ORAL_TABLET | ORAL | 0 refills | Status: DC | PRN

## 2021-10-04 MED ORDER — ACETAMINOPHEN 10 MG/ML IV SOLN: 1000.0000 mg | Freq: Four times a day (QID) | INTRAVENOUS | Status: AC

## 2021-10-04 MED ORDER — TRAMADOL HCL 50 MG PO TABS: 50.0000 mg | ORAL_TABLET | ORAL | 0 refills | Status: DC | PRN

## 2021-10-04 MED ORDER — CELECOXIB 200 MG PO CAPS: 200.0000 mg | ORAL_CAPSULE | Freq: Two times a day (BID) | ORAL | 0 refills | Status: AC

## 2021-10-04 MED ORDER — ENOXAPARIN SODIUM 40 MG/0.4ML IJ SOSY: 40.0000 mg | PREFILLED_SYRINGE | INTRAMUSCULAR | 0 refills | Status: DC

## 2021-10-04 MED ORDER — SENNOSIDES-DOCUSATE SODIUM 8.6-50 MG PO TABS: 1.0000 | ORAL_TABLET | Freq: Two times a day (BID) | ORAL | 0 refills | Status: AC

## 2021-10-04 NOTE — Discharge Summary (Signed)
?Physician Discharge Summary  ?Patient ID: ?Meredith Reyes ?MRN: [id number] ?DOB/AGE: 12-04-1955 66 y.o. ? ?Admit date: 10/03/2021 ?Discharge date: 10/04/2021 ? ?Admission Diagnoses:  ?Total knee replacement status [Z96.659] ? ? ?Discharge Diagnoses: ?Patient Active Problem List  ? Diagnosis Date Noted  ? Total knee replacement status 10/03/2021  ? Persistent atrial fibrillation (HCC) 09/03/2021  ? Prediabetes 12/09/2018  ? HTN, goal below 140/90 11/27/2016  ? Primary osteoarthritis of both knees 11/23/2014  ? Pure hypercholesterolemia 11/23/2014  ? ? ?Past Medical History:  ?Diagnosis Date  ? Atrial fibrillation (HCC) 08/29/2021  ? a.) CHA2DS2-VASc = 3 (age, sex, HTN). b.) Rate/rhythm maintained with oral carvedilol; will start apixaban after knee surgery if cleared by ortho (will start 09/13/2021).  ? GERD (gastroesophageal reflux disease)   ? Hypertension   ? ?  ?Transfusion: none ?  ?Consultants (if any):  ? ?Discharged Condition: Improved ? ?Hospital Course: Meredith Reyes is an 66 y.o. female who was admitted 10/03/2021 with a diagnosis of Total knee replacement status and went to the operating room on 10/03/2021 and underwent the above named procedures.  ?  ?Surgeries: Procedure(s): ?COMPUTER ASSISTED TOTAL KNEE ARTHROPLASTY - RNFA on 10/03/2021 ?Patient tolerated the surgery well. Taken to PACU where she was stabilized and then transferred to the orthopedic floor. ? ?Started on Lovenox 30 mg q 12 hrs. SCDs applied. Heels elevated on bed. No evidence of DVT. Negative Homan. ?Physical therapy started on day #1 for gait training and transfer. OT started day #1 for ADL and assisted devices. ? ?Patient's foley was d/c on day #1. Patient's IV was d/c on day #1. ? ?On post op day #1 patient was stable and ready for discharge to home with HHPT. ? ?Implants: DePuy Attune size 5N posterior stabilized femoral component (cemented), size 5 rotating platform tibial component (cemented), 38 mm medialized dome patella (cemented),  and a 7 mm stabilized rotating platform polyethylene insert. ? ?She was given perioperative antibiotics:  ?Anti-infectives (From admission, onward)  ? ? Start     Dose/Rate Route Frequency Ordered Stop  ? 10/03/21 1600  ceFAZolin (ANCEF) IVPB 2g/100 mL premix       ? 2 g ?200 mL/hr over 30 Minutes Intravenous Every 6 hours 10/03/21 1523 10/04/21 0359  ? 10/03/21 0645  ceFAZolin (ANCEF) 2-4 GM/100ML-% IVPB       ?Note to Pharmacy: Guadalupe Dawn B: cabinet override  ?    10/03/21 0645 10/03/21 0845  ? 10/03/21 0600  ceFAZolin (ANCEF) IVPB 2g/100 mL premix       ? 2 g ?200 mL/hr over 30 Minutes Intravenous On call to O.R. 10/03/21 0021 10/03/21 0914  ? ?  ?. ? ?She was given sequential compression devices, early ambulation, and Lovenox TEDs for DVT prophylaxis. ? ?She benefited maximally from the hospital stay and there were no complications.   ? ?Recent vital signs:  ?Vitals:  ? 10/04/21 0344 10/04/21 0840  ?BP: (!) 110/55 (!) 101/44  ?Pulse: (!) 52 76  ?Resp: 14 18  ?Temp: 98 ?F (36.7 ?C) (!) 97.5 ?F (36.4 ?C)  ?SpO2: 99% 99%  ? ? ?Recent laboratory studies:  ?Lab Results  ?Component Value Date  ? HGB 11.7 (L) 08/29/2021  ? ?Lab Results  ?Component Value Date  ? WBC 5.6 08/29/2021  ? PLT 345 08/29/2021  ? ?No results found for: INR ?Lab Results  ?Component Value Date  ? NA 137 08/29/2021  ? K 3.7 08/29/2021  ? CL 103 08/29/2021  ? CO2 27 08/29/2021  ?  BUN 16 08/29/2021  ? CREATININE 0.52 08/29/2021  ? GLUCOSE 97 08/29/2021  ? ? ?Discharge Medications:   ?Allergies as of 10/04/2021   ? ?   Reactions  ? Pravastatin Other (See Comments)  ? Muscle pain.  ? ?  ? ?  ?Medication List  ?  ? ?STOP taking these medications   ? ?acetaminophen 650 MG CR tablet ?Commonly known as: TYLENOL ?Replaced by: acetaminophen 10 MG/ML Soln ?  ? ?  ? ?TAKE these medications   ? ?acetaminophen 10 MG/ML Soln ?Commonly known as: OFIRMEV ?Inject 100 mLs (1,000 mg total) into the vein every 6 (six) hours. ?Replaces: acetaminophen 650 MG CR  tablet ?  ?carvedilol 12.5 MG tablet ?Commonly known as: COREG ?Take 12.5 mg by mouth 2 (two) times daily with a meal. ?  ?celecoxib 200 MG capsule ?Commonly known as: CELEBREX ?Take 1 capsule (200 mg total) by mouth 2 (two) times daily. ?  ?enoxaparin 40 MG/0.4ML injection ?Commonly known as: LOVENOX ?Inject 0.4 mLs (40 mg total) into the skin daily for 14 days. ?  ?omeprazole 20 MG capsule ?Commonly known as: PRILOSEC ?Take 20 mg by mouth every morning. ?  ?oxyCODONE 5 MG immediate release tablet ?Commonly known as: Oxy IR/ROXICODONE ?Take 1 tablet (5 mg total) by mouth every 4 (four) hours as needed for moderate pain (pain score 4-6). ?  ?senna-docusate 8.6-50 MG tablet ?Commonly known as: Senokot-S ?Take 1 tablet by mouth 2 (two) times daily. ?  ?traMADol 50 MG tablet ?Commonly known as: ULTRAM ?Take 1-2 tablets (50-100 mg total) by mouth every 4 (four) hours as needed for moderate pain. ?  ? ?  ? ?  ?  ? ? ?  ?Durable Medical Equipment  ?(From admission, onward)  ?  ? ? ?  ? ?  Start     Ordered  ? 10/03/21 1524  DME Walker rolling  Once       ?Question:  Patient needs a walker to treat with the following condition  Answer:  Total knee replacement status  ? 10/03/21 1523  ? 10/03/21 1524  DME Bedside commode  Once       ?Question:  Patient needs a bedside commode to treat with the following condition  Answer:  Total knee replacement status  ? 10/03/21 1523  ? ?  ?  ? ?  ? ? ?Diagnostic Studies: DG Knee Right Port ? ?Result Date: 10/03/2021 ?CLINICAL DATA:  Right knee arthroplasty EXAM: PORTABLE RIGHT KNEE - 1-2 VIEW COMPARISON:  None. FINDINGS: There is right knee arthroplasty. Skin staples are seen anteriorly. There are pockets of air in the soft tissues. No fracture or dislocation is seen. IMPRESSION: Status post right knee arthroplasty. Electronically Signed   By: Ernie Avena M.D.   On: 10/03/2021 13:23   ? ?Disposition:  ? ? ? ? Follow-up Information   ? ? Lasandra Beech B, PA-C Follow up on  10/17/2021.   ?Specialty: Orthopedic Surgery ?Why: at 9:45am ?Contact information: ?[address] ?[facility] ?[place] [address] ?[phone] ? ? ?  ?  ? ? Donato Heinz, MD Follow up on 11/13/2021.   ?Specialty: Orthopedic Surgery ?Why: at 9:30am ?Contact information: ?[address] ?[facility] ?[place] [address] ?[phone] ? ? ?  ?  ? ?  ?  ? ?  ? ? ? ?Signed: ?Amador Cunas CHRISTOPHER ?10/04/2021, 9:06 AM ? ? ?  ?

## 2021-10-04 NOTE — Progress Notes (Signed)
? ?  Subjective: ?1 Day Post-Op Procedure(s) (LRB): ?COMPUTER ASSISTED TOTAL KNEE ARTHROPLASTY - RNFA (Right) ?Patient reports pain as 0 on 0-10 scale.   ?Patient is well, and has had no acute complaints or problems ?Denies any CP, SOB, ABD pain. ?We will continue therapy today.  ?Plan is to go Home after hospital stay. ? ?Objective: ?Vital signs in last 24 hours: ?Temp:  [97.2 ?F (36.2 ?C)-98.6 ?F (37 ?C)] 97.5 ?F (36.4 ?C) (03/04 0840) ?Pulse Rate:  [43-76] 76 (03/04 0840) ?Resp:  [14-19] 18 (03/04 0840) ?BP: (101-169)/(44-90) 101/44 (03/04 0840) ?SpO2:  [96 %-100 %] 99 % (03/04 0840) ? ?Intake/Output from previous day: ?03/03 0701 - 03/04 0700 ?In: 2040 [P.O.:240; I.V.:1500; IV Piggyback:300] ?Out: 2025 [id number]; Blood:50] ?Intake/Output this shift: ?No intake/output data recorded. ? ?No results for input(s): HGB in the last 72 hours. ?No results for input(s): WBC, RBC, HCT, PLT in the last 72 hours. ?No results for input(s): NA, K, CL, CO2, BUN, CREATININE, GLUCOSE, CALCIUM in the last 72 hours. ?No results for input(s): LABPT, INR in the last 72 hours. ? ?EXAM ?General - Patient is Alert, Appropriate, and Oriented ?Extremity - Neurologically intact ?Neurovascular intact ?Sensation intact distally ?Intact pulses distally ?Dorsiflexion/Plantar flexion intact ?Dressing - dressing C/D/I and no drainage, provena intact with out drainage ?Motor Function - intact, moving foot and toes well on exam.  ? ?Past Medical History:  ?Diagnosis Date  ? Atrial fibrillation ([place]) 08/29/2021  ? a.) CHA2DS2-VASc = 3 (age, sex, HTN). b.) Rate/rhythm maintained with oral carvedilol; will start apixaban after knee surgery if cleared by ortho (will start 09/13/2021).  ? GERD (gastroesophageal reflux disease)   ? Hypertension   ? ? ?Assessment/Plan:   ?1 Day Post-Op Procedure(s) (LRB): ?COMPUTER ASSISTED TOTAL KNEE ARTHROPLASTY - RNFA (Right) ?Principal Problem: ?  Total knee replacement status ? ?Estimated body mass index is 40.25  kg/m? as calculated from the following: ?  Height as of this encounter: 5\' 7"  (1.702 m). ?  Weight as of this encounter: 116.6 kg. ?Advance diet ?Up with therapy ?VSS ?Pain controlled ?CM to assist with discharge to home with HHPT today pending completion of PT goals ? ?DVT Prophylaxis - Lovenox, TED hose, and SCDs ?Weight-Bearing as tolerated to right leg ? ? ?T. Rachelle Hora, PA-C ?[place] ?10/04/2021, 9:00 AM ?  ?

## 2021-10-04 NOTE — Plan of Care (Signed)
Patient discharged home per MD orders at this time.All discharge instructions,education and medications reviewed with the patient.Pt expressed understanding and will comply with dc instructions.follow up appointments was also communicated to the patient.no verbal c/o or any ssx of distress.Pt was discharged home with HH/PT per order.patient was transported home by daughter in a privately owned vehicle. ?

## 2021-10-04 NOTE — Anesthesia Postprocedure Evaluation (Signed)
Anesthesia Post Note ? ?Patient: Meredith Reyes ? ?Procedure(s) Performed: COMPUTER ASSISTED TOTAL KNEE ARTHROPLASTY - RNFA (Right: Knee) ? ?Patient location during evaluation: Nursing Unit ?Anesthesia Type: Spinal ?Level of consciousness: awake and alert ?Pain management: pain level controlled ?Vital Signs Assessment: post-procedure vital signs reviewed and stable ?Respiratory status: spontaneous breathing and respiratory function stable ?Cardiovascular status: blood pressure returned to baseline and stable ?Postop Assessment: no headache, no backache, able to ambulate and adequate PO intake ?Anesthetic complications: no ? ? ?No notable events documented. ? ? ?Last Vitals:  ?Vitals:  ? 10/04/21 0344 10/04/21 0840  ?BP: (!) 110/55 (!) 101/44  ?Pulse: (!) 52 76  ?Resp: 14 18  ?Temp: 36.7 ?C (!) 36.4 ?C  ?SpO2: 99% 99%  ?  ?Last Pain:  ?Vitals:  ? 10/04/21 0840  ?TempSrc:   ?PainSc: 0-No pain  ? ? ?  ?  ?  ?  ?  ?  ? ?Lenard Simmer ? ? ? ? ?

## 2021-10-04 NOTE — Evaluation (Signed)
Occupational Therapy Evaluation ?Patient Details ?Name: Meredith Reyes ?MRN: [id number] ?DOB: [birth year] ?Today's Date: 10/04/2021 ? ? ?History of Present Illness Pt is a 66 y.o. female s/p elective R TKA.  ? ?Clinical Impression ?  ?Pt seen for OT evaluation this date, POD#1 from above surgery. Pt was modified independent in all ADL prior to surgery and is eager to return to PLOF with less pain and improved safety and independence. Pt currently requires PRN MIN A for LB dressing, polar care mgt, and compression stocking mgt while in seated position due to limited AROM/strength of R knee. Pt instructed in polar care mgt, falls prevention strategies, home/routines modifications, DME/AE for LB bathing and dressing tasks, and compression stocking mgt. Handout provided to support recall and carryover. All education/training provided at time of evaluation. Pt verbalized understanding. No additional skilled OT needs. Will sign off.   ? ?Recommendations for follow up therapy are one component of a multi-disciplinary discharge planning process, led by the attending physician.  Recommendations may be updated based on patient status, additional functional criteria and insurance authorization.  ? ?Follow Up Recommendations ? No OT follow up  ?  ?Assistance Recommended at Discharge PRN  ?Patient can return home with the following A little help with bathing/dressing/bathroom;Assistance with cooking/housework;Assist for transportation;Help with stairs or ramp for entrance ? ?  ?Functional Status Assessment ? Patient has had a recent decline in their functional status and demonstrates the ability to make significant improvements in function in a reasonable and predictable amount of time.  ?Equipment Recommendations ? None recommended by OT  ?  ?Recommendations for Other Services   ? ? ?  ?Precautions / Restrictions Precautions ?Precautions: Knee;Fall ?Precaution Booklet Issued: Yes (comment) ?Restrictions ?Weight Bearing  Restrictions: Yes ?RLE Weight Bearing: Weight bearing as tolerated  ? ?  ? ?Mobility Bed Mobility ?  ?  ?  ?  ?  ?  ?  ?General bed mobility comments: NT, up in recliner ?  ? ?Transfers ?Overall transfer level: Needs assistance ?Equipment used: Rolling walker (2 wheels) ?Transfers: Sit to/from Stand ?Sit to Stand: Supervision, Min guard ?  ?  ?  ?  ?  ?  ?  ? ?  ?Balance Overall balance assessment: Needs assistance ?Sitting-balance support: Bilateral upper extremity supported, Feet supported ?Sitting balance-Leahy Scale: Fair ?  ?  ?Standing balance support: Bilateral upper extremity supported, During functional activity ?Standing balance-Leahy Scale: Fair ?  ?  ?  ?  ?  ?  ?  ?  ?  ?  ?  ?  ?   ? ?ADL either performed or assessed with clinical judgement  ? ?ADL   ?  ?  ?  ?  ?  ?  ?  ?  ?  ?  ?  ?  ?  ?  ?  ?  ?  ?  ?  ?General ADL Comments: PRN MIN A for LB ADL tasks 2/2 decr strength/ROM in R knee, grandson able to assist wiht compression stockings and polar care  ? ? ? ?Vision   ?   ?   ?Perception   ?  ?Praxis   ?  ? ?Pertinent Vitals/Pain Pain Assessment ?Pain Assessment: No/denies pain  ? ? ? ?Hand Dominance   ?  ?Extremity/Trunk Assessment Upper Extremity Assessment ?Upper Extremity Assessment: Overall WFL for tasks assessed ?  ?Lower Extremity Assessment ?Lower Extremity Assessment: RLE deficits/detail ?RLE Deficits / Details: R TKA ?  ?  ?  ?Communication Communication ?Communication: No  difficulties ?  ?Cognition Arousal/Alertness: Awake/alert ?Behavior During Therapy: [facility] for tasks assessed/performed ?Overall Cognitive Status: Within Functional Limits for tasks assessed ?  ?  ?  ?  ?  ?  ?  ?  ?  ?  ?  ?  ?  ?  ?  ?  ?  ?  ?  ?General Comments    ? ?  ?Exercises Other Exercises ?Other Exercises: Pt educated in home/routines modifications, AE/DME, falls prevention, polar care mgt, and compression stocking mgt, handout provided ?  ?Shoulder Instructions    ? ? ?Home Living Family/patient expects to be  discharged to:: Private residence ?Living Arrangements: Children;Other relatives (grandson primarily) ?Available Help at Discharge: Family;Available 24 hours/day ?Type of Home: House ?Home Access: Ramped entrance ?  ?  ?Home Layout: One level ?  ?  ?Bathroom Shower/Tub: Walk-in shower (currently sponge baths) ?  ?Bathroom Toilet: Standard (uses BSC at baseline) ?Bathroom Accessibility: Yes ?  ?Home Equipment: Rolling Walker (2 wheels);BSC/3in1;Shower seat;Other (comment) (bed that has features that elevate/recline) ?  ?  ?  ? ?  ?Prior Functioning/Environment Prior Level of Function : Independent/Modified Independent;Working/employed;Driving ?  ?  ?  ?  ?  ?  ?Mobility Comments: use of RW at baseline ?ADLs Comments: Grand son/family assists with meals and home tasks. Otherwise modified independent (sponge baths, AE for LB ADL). All set up to work from home ?  ? ?  ?  ?OT Problem List: Decreased range of motion;Decreased strength ?  ?   ?OT Treatment/Interventions:    ?  ?OT Goals(Current goals can be found in the care plan section) Acute Rehab OT Goals ?Patient Stated Goal: go home ?OT Goal Formulation: All assessment and education complete, DC therapy  ?OT Frequency:   ?  ? ?Co-evaluation   ?  ?  ?  ?  ? ?  ?AM-PAC OT "6 Clicks" Daily Activity     ?Outcome Measure Help from another person eating meals?: None ?Help from another person taking care of personal grooming?: None ?Help from another person toileting, which includes using toliet, bedpan, or urinal?: None ?Help from another person bathing (including washing, rinsing, drying)?: A Little ?Help from another person to put on and taking off regular upper body clothing?: None ?Help from another person to put on and taking off regular lower body clothing?: A Little ?6 Click Score: 22 ?  ?End of Session Equipment Utilized During Treatment: Rolling walker (2 wheels);Gait belt ? ?Activity Tolerance: Patient tolerated treatment well ?Patient left: in chair;with call  bell/phone within reach ? ?OT Visit Diagnosis: Other abnormalities of gait and mobility (R26.89)  ?              ?Time: 1002-1020 ?OT Time Calculation (min): 18 min ?Charges:  OT General Charges ?$OT Visit: 1 Visit ?OT Evaluation ?$OT Eval Low Complexity: 1 Low ?OT Treatments ?$Self Care/Home Management : 8-22 mins ? ?Ardeth Perfect., MPH, MS, OTR/L ?ascom [phone] ?10/04/21, 10:29 AM ?

## 2021-10-06 ENCOUNTER — Encounter: Payer: Self-pay | Admitting: Orthopedic Surgery

## 2021-10-10 DIAGNOSIS — Z471 Aftercare following joint replacement surgery: Secondary | ICD-10-CM | POA: Diagnosis not present

## 2021-10-17 DIAGNOSIS — M25561 Pain in right knee: Secondary | ICD-10-CM | POA: Diagnosis not present

## 2021-10-17 DIAGNOSIS — M25461 Effusion, right knee: Secondary | ICD-10-CM | POA: Diagnosis not present

## 2021-10-22 DIAGNOSIS — M25461 Effusion, right knee: Secondary | ICD-10-CM | POA: Diagnosis not present

## 2021-10-22 DIAGNOSIS — M25561 Pain in right knee: Secondary | ICD-10-CM | POA: Diagnosis not present

## 2021-10-29 DIAGNOSIS — M25561 Pain in right knee: Secondary | ICD-10-CM | POA: Diagnosis not present

## 2021-10-29 DIAGNOSIS — M25461 Effusion, right knee: Secondary | ICD-10-CM | POA: Diagnosis not present

## 2021-10-31 DIAGNOSIS — M25461 Effusion, right knee: Secondary | ICD-10-CM | POA: Diagnosis not present

## 2021-10-31 DIAGNOSIS — M25561 Pain in right knee: Secondary | ICD-10-CM | POA: Diagnosis not present

## 2021-11-04 DIAGNOSIS — M25561 Pain in right knee: Secondary | ICD-10-CM | POA: Diagnosis not present

## 2021-11-04 DIAGNOSIS — M25461 Effusion, right knee: Secondary | ICD-10-CM | POA: Diagnosis not present

## 2021-11-06 DIAGNOSIS — M25461 Effusion, right knee: Secondary | ICD-10-CM | POA: Diagnosis not present

## 2021-11-06 DIAGNOSIS — M25561 Pain in right knee: Secondary | ICD-10-CM | POA: Diagnosis not present

## 2021-11-11 DIAGNOSIS — M25461 Effusion, right knee: Secondary | ICD-10-CM | POA: Diagnosis not present

## 2021-11-11 DIAGNOSIS — M25561 Pain in right knee: Secondary | ICD-10-CM | POA: Diagnosis not present

## 2021-11-13 DIAGNOSIS — M25561 Pain in right knee: Secondary | ICD-10-CM | POA: Diagnosis not present

## 2021-11-13 DIAGNOSIS — Z96651 Presence of right artificial knee joint: Secondary | ICD-10-CM | POA: Diagnosis not present

## 2021-11-13 DIAGNOSIS — M25461 Effusion, right knee: Secondary | ICD-10-CM | POA: Diagnosis not present

## 2021-11-13 DIAGNOSIS — M1712 Unilateral primary osteoarthritis, left knee: Secondary | ICD-10-CM | POA: Diagnosis not present

## 2021-11-18 DIAGNOSIS — M25561 Pain in right knee: Secondary | ICD-10-CM | POA: Diagnosis not present

## 2021-11-18 DIAGNOSIS — M25461 Effusion, right knee: Secondary | ICD-10-CM | POA: Diagnosis not present

## 2021-11-20 DIAGNOSIS — M25561 Pain in right knee: Secondary | ICD-10-CM | POA: Diagnosis not present

## 2021-11-20 DIAGNOSIS — M25461 Effusion, right knee: Secondary | ICD-10-CM | POA: Diagnosis not present

## 2021-11-27 DIAGNOSIS — M25461 Effusion, right knee: Secondary | ICD-10-CM | POA: Diagnosis not present

## 2021-11-27 DIAGNOSIS — M25561 Pain in right knee: Secondary | ICD-10-CM | POA: Diagnosis not present

## 2021-12-04 DIAGNOSIS — M25561 Pain in right knee: Secondary | ICD-10-CM | POA: Diagnosis not present

## 2021-12-04 DIAGNOSIS — M25461 Effusion, right knee: Secondary | ICD-10-CM | POA: Diagnosis not present

## 2021-12-19 NOTE — Discharge Instructions (Signed)
Instructions after Total Knee Replacement   Elleen Coulibaly P. Mehar Kirkwood, Jr., M.D.     Dept. of Orthopaedics & Sports Medicine  [facility]  [address]  [place], [place]  [address]  Phone: [phone]   Fax: [phone]    DIET: Drink plenty of non-alcoholic fluids. Resume your normal diet. Include foods high in fiber.  ACTIVITY:  You may use crutches or a walker with weight-bearing as tolerated, unless instructed otherwise. You may be weaned off of the walker or crutches by your Physical Therapist.  Do NOT place pillows under the knee. Anything placed under the knee could limit your ability to straighten the knee.   Continue doing gentle exercises. Exercising will reduce the pain and swelling, increase motion, and prevent muscle weakness.   Please continue to use the TED compression stockings for 6 weeks. You may remove the stockings at night, but should reapply them in the morning. Do not drive or operate any equipment until instructed.  WOUND CARE:  Continue to use the PolarCare or ice packs periodically to reduce pain and swelling. You may bathe or shower after the staples are removed at the first office visit following surgery.  MEDICATIONS: You may resume your regular medications. Please take the pain medication as prescribed on the medication. Do not take pain medication on an empty stomach. You have been given a prescription for a blood thinner (Lovenox or Coumadin). Please take the medication as instructed. (NOTE: After completing a 2 week course of Lovenox, take one Enteric-coated aspirin once a day. This along with elevation will help reduce the possibility of phlebitis in your operated leg.) Do not drive or drink alcoholic beverages when taking pain medications.  CALL THE OFFICE FOR: Temperature above 101 degrees Excessive bleeding or drainage on the dressing. Excessive swelling, coldness, or paleness of the toes. Persistent nausea and vomiting.  FOLLOW-UP:  You  should have an appointment to return to the office in 10-14 days after surgery. Arrangements have been made for continuation of Physical Therapy (either home therapy or outpatient therapy).   [facility] Department Directory         [email or web address]       [email or web address]          Cardiology  Appointments: [place] - [phone] [place] - [phone]  Endocrinology  Appointments: [place] - [phone] [place] - [phone]  Gastroenterology  Appointments: [place] - [phone] [place] - [phone]        General Surgery   Appointments: [place] - [phone]  Internal Medicine/Family Medicine  Appointments: [place] - [phone] [place] - [phone] [place] - [phone]  Metabolic and Weigh Loss Surgery  Appointments: [place] - [phone]        Neurology  Appointments: [place] - [phone] [place] - [phone]  Neurosurgery  Appointments: [place] - [phone]  Obstetrics & Gynecology  Appointments: [place] - [phone] [place] - [phone]        Pediatrics  Appointments: [place] - [phone] [place] - [phone]  Physiatry  Appointments: [place] -[phone]  Physical Therapy  Appointments: [place] - [phone] [place] - [phone]        Podiatry  Appointments: [place] - [phone] [place] - [phone]  Pulmonology  Appointments: [place] - [phone]  Rheumatology  Appointments: [place] - [phone]        [place] Location: [facility]  [address] [place], [place]  [address]  [place] Location: [facility] [address] [place], [place]  [address]  [place] Location: [facility] [address] [place], [place]  [address]    

## 2021-12-25 DIAGNOSIS — M1712 Unilateral primary osteoarthritis, left knee: Secondary | ICD-10-CM | POA: Diagnosis not present

## 2021-12-26 ENCOUNTER — Encounter: Payer: Self-pay | Admitting: Orthopedic Surgery

## 2021-12-26 ENCOUNTER — Other Ambulatory Visit: Payer: Self-pay

## 2021-12-26 ENCOUNTER — Encounter
Admission: RE | Admit: 2021-12-26 | Discharge: 2021-12-26 | Disposition: A | Discharge status: Home / Self Care | Payer: Medicare HMO | Source: Ambulatory Visit | Attending: Orthopedic Surgery | Admitting: Orthopedic Surgery

## 2021-12-26 VITALS — BP 151/71 | HR 72 | Resp 16

## 2021-12-26 DIAGNOSIS — I4819 Other persistent atrial fibrillation: Secondary | ICD-10-CM

## 2021-12-26 DIAGNOSIS — Z01812 Encounter for preprocedural laboratory examination: Secondary | ICD-10-CM

## 2021-12-26 DIAGNOSIS — E7800 Pure hypercholesterolemia, unspecified: Secondary | ICD-10-CM

## 2021-12-26 DIAGNOSIS — M1712 Unilateral primary osteoarthritis, left knee: Secondary | ICD-10-CM

## 2021-12-26 DIAGNOSIS — R7303 Prediabetes: Secondary | ICD-10-CM | POA: Diagnosis not present

## 2021-12-26 HISTORY — DX: Unspecified osteoarthritis, unspecified site: M19.90

## 2021-12-26 LAB — TYPE AND SCREEN
ABO/RH(D): O NEG
Antibody Screen: NEGATIVE

## 2021-12-26 LAB — URINALYSIS, ROUTINE W REFLEX MICROSCOPIC
Bilirubin Urine: NEGATIVE
Glucose, UA: NEGATIVE mg/dL
Hgb urine dipstick: NEGATIVE
Ketones, ur: NEGATIVE mg/dL
Leukocytes,Ua: NEGATIVE
Nitrite: NEGATIVE
Protein, ur: NEGATIVE mg/dL
Specific Gravity, Urine: 1.016 (ref 1.005–1.030)
pH: 5 (ref 5.0–8.0)

## 2021-12-26 LAB — SURGICAL PCR SCREEN
MRSA, PCR: NEGATIVE
Staphylococcus aureus: NEGATIVE

## 2021-12-26 LAB — SEDIMENTATION RATE: Sed Rate: 71 mm/hr — ABNORMAL HIGH (ref 0–30)

## 2021-12-26 LAB — COMPREHENSIVE METABOLIC PANEL
ALT: 10 U/L (ref 0–44)
AST: 16 U/L (ref 15–41)
Albumin: 3.7 g/dL (ref 3.5–5.0)
Alkaline Phosphatase: 218 U/L — ABNORMAL HIGH (ref 38–126)
Anion gap: 10 (ref 5–15)
BUN: 14 mg/dL (ref 8–23)
CO2: 24 mmol/L (ref 22–32)
Calcium: 9.2 mg/dL (ref 8.9–10.3)
Chloride: 104 mmol/L (ref 98–111)
Creatinine, Ser: 0.65 mg/dL (ref 0.44–1.00)
GFR, Estimated: >60 mL/min (ref >60)
Glucose, Bld: 98 mg/dL (ref 70–99)
Potassium: 4 mmol/L (ref 3.5–5.1)
Sodium: 138 mmol/L (ref 135–145)
Total Bilirubin: 0.9 mg/dL (ref 0.3–1.2)
Total Protein: 8.1 g/dL (ref 6.5–8.1)

## 2021-12-26 LAB — CBC
HCT: 37.1 % (ref 36.0–46.0)
Hemoglobin: 11.7 g/dL — ABNORMAL LOW (ref 12.0–15.0)
MCH: 27.7 pg (ref 26.0–34.0)
MCHC: 31.5 g/dL (ref 30.0–36.0)
MCV: 87.9 fL (ref 80.0–100.0)
Platelets: 331 10*3/uL (ref 150–400)
RBC: 4.22 MIL/uL (ref 3.87–5.11)
RDW: 14.9 % (ref 11.5–15.5)
WBC: 6 10*3/uL (ref 4.0–10.5)
nRBC: 0 % (ref 0.0–0.2)

## 2021-12-26 LAB — C-REACTIVE PROTEIN: CRP: 1.2 mg/dL — ABNORMAL HIGH (ref <1.0)

## 2021-12-26 NOTE — Progress Notes (Signed)
Perioperative Services  Pre-Admission/Anesthesia Testing Clinical Review  Date: 12/31/21  Patient Demographics:  Name: Meredith Reyes DOB:   1956-03-20 MRN:   [id number]  Planned Surgical Procedure(s):    Case: [id number] Date/Time: 01/07/22 1233   Procedure: COMPUTER ASSISTED TOTAL LEFT KNEE ARTHROPLASTY   Anesthesia type: Choice   Pre-op diagnosis: PRIMARY OSTEOARTHRITIS OF LEFT KNEE.   Location: [facility] OR ROOM 01 / [place] ORS FOR ANESTHESIA GROUP   Surgeons: Dereck Leep, MD   NOTE: Available PAT nursing documentation and vital signs have been reviewed. Clinical nursing staff has updated patient's PMH/PSHx, current medication list, and drug allergies/intolerances to ensure comprehensive history available to assist in medical decision making as it pertains to the aforementioned surgical procedure and anticipated anesthetic course. Extensive review of available clinical information performed. Meredith Reyes PMH and PSHx updated with any diagnoses/procedures that  may have been inadvertently omitted during her intake with the pre-admission testing department's nursing staff.  Clinical Discussion:  Meredith Reyes is a 66 y.o. female who is submitted for pre-surgical anesthesia review and clearance prior to her undergoing the above procedure. Patient has never been a smoker. Pertinent PMH includes: atrial fibrillation, HTN, HLD, prediabetes, GERD (on daily PPI), OA.   Patient is followed by cardiology Saralyn Pilar, MD). She was last seen in the cardiology clinic on 10/01/2021; notes reviewed. At the time of her clinic visit, the patient denied any chest pain, shortness of breath, PND, orthopnea, palpitations, significant peripheral edema, vertiginous symptoms, or presyncope/syncope.  Patient with a PMH significant for cardiovascular diagnoses.  Patient found to be in new onset atrial fibrillation back in 08/2021.  She was asymptomatic at the time.  Patient was referred to cardiology for further  evaluation and testing.  Patient underwent noninvasive cardiovascular testing.  Long-term cardiac event monitor study performed on 09/03/2021 revealing a predominant underlying atrial fibrillation with a mean heart rate of 74 bpm; range 32-137 bpm. There were infrequent PVCs present.  There were no patient triggered events noted on her study log.  TTE performed on 09/22/2021 revealed a low normal left ventricular systolic function with an EF of 50%.  Left atrium was mildly enlarged.  There was trivial pulmonic and mild mitral/tricuspid valve regurgitation.  There was no evidence of a significant transvalvular gradient to suggest stenosis.  Patient with a CHA2DS2-VASc Score = 3 (age, sex, HTN).  Rate and rhythm maintained on oral carvedilol. Patient started on chronic anticoagulation therapy during this visit.  She is now taking standard dose apixaban twice daily with no evidence or reports of GI bleeding. Blood pressure mildly elevated and in the 140/90 range on currently prescribed beta-blocker monotherapy.  Patient not currently taking any type of lipid-lowering therapies for her HLD diagnosis or ASCVD prevention.  She has a prediabetes diagnosis; last HgbA1c was 5.6% when checked on 06/20/2021.  Functional capacity limited by arthritides, however patient still felt to be able to achieve at least 4 METS of activity without angina/anginal equivalent symptoms.  No other changes were made to her medication regimen.  Patient to follow-up with outpatient cardiology at defined intervals for ongoing management of her cardiovascular diagnoses.  Meredith Reyes is scheduled for an elective LEFT COMPUTER ASSISTED TOTAL KNEE ARTHROPLASTY on 12/31/2021 with Dr. Skip Estimable, MD. Given patient's past medical history significant for cardiovascular diagnoses, presurgical cardiac clearance was sought by the PAT team. Per cardiology, "this patient is optimized for surgery and may proceed with the planned procedural course  with a LOW risk of significant  perioperative cardiovascular complications".  Again, this patient is on daily anticoagulation therapy.  She has been instructed on recommendations from her cardiologist for holding her apixaban for 3 days prior to her procedure with plans to restart as soon as postoperatively respectively minimized by her primary attending surgeon.  The patient is aware that her last dose of apixaban should be on 01/03/2022.  Patient denies previous perioperative complications with anesthesia in the past. In review of the available records, it is noted that patient underwent a neuraxial anesthetic course here at [facility] ([facility]) in 10/2021 without documented complications.      12/26/2021    9:20 AM 10/04/2021    8:40 AM 10/04/2021    3:44 AM  Vitals with BMI  Systolic [id number]  Diastolic 71 44 55  Pulse 72 76 52    Providers/Specialists:   NOTE: Primary physician provider listed below. Patient may have been seen by APP or partner within same practice.   PROVIDER ROLE / SPECIALTY LAST OV  Hooten, Laurice Record, MD Orthopedics (Surgeon) 12/25/2021  Kirk Ruths, MD Primary Care Provider 06/20/2021  Isaias Cowman, MD Cardiology 10/01/2021   Allergies:  Pravastatin  Current Home Medications:   No current facility-administered medications for this encounter.    acetaminophen (OFIRMEV) 10 MG/ML SOLN   apixaban (ELIQUIS) 5 MG TABS tablet   carvedilol (COREG) 12.5 MG tablet   celecoxib (CELEBREX) 200 MG capsule   enoxaparin (LOVENOX) 40 MG/0.4ML injection   omeprazole (PRILOSEC) 20 MG capsule   oxyCODONE (OXY IR/ROXICODONE) 5 MG immediate release tablet   senna-docusate (SENOKOT-S) 8.6-50 MG tablet   traMADol (ULTRAM) 50 MG tablet   History:   Past Medical History:  Diagnosis Date   Arthritis    Atrial fibrillation ([place]) 08/29/2021   a.) new onset; discovered on preop ECG 08/29/2021. b.) CHA2DS2-VASc = 3 (age, sex,  HTN). c.) rate/rhythm maintained with oral carvedilol; on chronic anticoagulation using apixaban (started 10/2021).   GERD (gastroesophageal reflux disease)    HLD (hyperlipidemia)    Hypertension    Long term current use of anticoagulant    a.) apixaban   Prediabetes    Past Surgical History:  Procedure Laterality Date   ABDOMINAL HYSTERECTOMY     BACK SURGERY     as a baby   ESOPHAGOGASTRODUODENOSCOPY (EGD) WITH PROPOFOL N/A 02/19/2020   Procedure: ESOPHAGOGASTRODUODENOSCOPY (EGD) WITH PROPOFOL;  Surgeon: Lesly Rubenstein, MD;  Location: [facility] ENDOSCOPY;  Service: Endoscopy;  Laterality: N/A;   KNEE ARTHROPLASTY Right 10/03/2021   Procedure: COMPUTER ASSISTED TOTAL KNEE ARTHROPLASTY - RNFA;  Surgeon: Dereck Leep, MD;  Location: [facility] ORS;  Service: Orthopedics;  Laterality: Right;   nodule on back     Family History  Problem Relation Age of Onset   Cancer Mother 28       throat ca   Breast cancer Neg Hx    Social History   Tobacco Use   Smoking status: Never   Smokeless tobacco: Never  Vaping Use   Vaping Use: Never used  Substance Use Topics   Alcohol use: Never   Drug use: Never    Pertinent Clinical Results:  LABS: Labs reviewed: Acceptable for surgery.  Component Date Value Ref Range Status   WBC 12/26/2021 6.0  4.0 - 10.5 K/uL Final   RBC 12/26/2021 4.22  3.87 - 5.11 MIL/uL Final   Hemoglobin 12/26/2021 11.7 (L)  12.0 - 15.0 g/dL Final   HCT 12/26/2021 37.1  36.0 - 46.0 % Final   MCV 12/26/2021 87.9  80.0 - 100.0 fL Final   MCH 12/26/2021 27.7  26.0 - 34.0 pg Final   MCHC 12/26/2021 31.5  30.0 - 36.0 g/dL Final   RDW 12/26/2021 14.9  11.5 - 15.5 % Final   Platelets 12/26/2021 331  150 - 400 K/uL Final   nRBC 12/26/2021 0.0  0.0 - 0.2 % Final   Performed at [facility], [place], [place] [address]   Sodium 12/26/2021 138  135 - 145 mmol/L Final   Potassium 12/26/2021 4.0  3.5 - 5.1 mmol/L Final   Chloride 12/26/2021 104  98 -  111 mmol/L Final   CO2 12/26/2021 24  22 - 32 mmol/L Final   Glucose, Bld 12/26/2021 98  70 - 99 mg/dL Final   Glucose reference range applies only to samples taken after fasting for at least 8 hours.   BUN 12/26/2021 14  8 - 23 mg/dL Final   Creatinine, Ser 12/26/2021 0.65  0.44 - 1.00 mg/dL Final   Calcium 12/26/2021 9.2  8.9 - 10.3 mg/dL Final   Total Protein 12/26/2021 8.1  6.5 - 8.1 g/dL Final   Albumin 12/26/2021 3.7  3.5 - 5.0 g/dL Final   AST 12/26/2021 16  15 - 41 U/L Final   ALT 12/26/2021 10  0 - 44 U/L Final   Alkaline Phosphatase 12/26/2021 218 (H)  38 - 126 U/L Final   Total Bilirubin 12/26/2021 0.9  0.3 - 1.2 mg/dL Final   GFR, Estimated 12/26/2021 >60  >60 mL/min Final   Comment: (NOTE) Calculated using the CKD-EPI Creatinine Equation (2021)    Anion gap 12/26/2021 10  5 - 15 Final   Performed at [facility], [place], [place] [address]   Color, Urine 12/26/2021 YELLOW (A)  YELLOW Final   APPearance 12/26/2021 CLEAR (A)  CLEAR Final   Specific Gravity, Urine 12/26/2021 1.016  1.005 - 1.030 Final   pH 12/26/2021 5.0  5.0 - 8.0 Final   Glucose, UA 12/26/2021 NEGATIVE  NEGATIVE mg/dL Final   Hgb urine dipstick 12/26/2021 NEGATIVE  NEGATIVE Final   Bilirubin Urine 12/26/2021 NEGATIVE  NEGATIVE Final   Ketones, ur 12/26/2021 NEGATIVE  NEGATIVE mg/dL Final   Protein, ur 12/26/2021 NEGATIVE  NEGATIVE mg/dL Final   Nitrite 12/26/2021 NEGATIVE  NEGATIVE Final   Leukocytes,Ua 12/26/2021 NEGATIVE  NEGATIVE Final   Performed at [facility], [address] [place], [place] [address]   CRP 12/26/2021 1.2 (H)  <1.0 mg/dL Final   Performed at [facility] [address] [place], [place] [address]   Sed Rate 12/26/2021 71 (H)  0 - 30 mm/hr Final   Performed at [facility], [address] [place], [place] [address]   MRSA, PCR 12/26/2021 NEGATIVE  NEGATIVE Final   Staphylococcus aureus 12/26/2021 NEGATIVE  NEGATIVE Final    Comment: (NOTE) The Xpert SA Assay (FDA approved for NASAL specimens in patients 54 years of age and older), is one component of a comprehensive surveillance program. It is not intended to diagnose infection nor to guide or monitor treatment. Performed at [facility], [address] [place], [place] [address]    ABO/RH(D) 12/26/2021 O NEG   Final   Antibody Screen 12/26/2021 NEG   Final   Sample Expiration 12/26/2021 01/09/2022,2359   Final   Extend sample reason 12/26/2021    Final  Value:NO TRANSFUSIONS OR PREGNANCY IN THE PAST 3 MONTHS Performed at [facility], [address] [place], [place] [address]     ECG: Date: 09/03/2021 Rate: 83 bpm Rhythm: atrial fibrillation Axis (leads I and aVF): Normal Intervals: QRS 70 ms. QTc 401 ms. ST segment and T wave changes: No evidence of acute ST segment elevation or depression Comparison: Similar to previous tracing obtained on 08/29/2021 NOTE: Tracing obtained at [facility]; unable for review. Above based on cardiologist's interpretation.    IMAGING / PROCEDURES: TRANSTHORACIC ECHOCARDIOGRAM performed on 09/22/2021 Low normal left ventricular systolic function with an EF of 50% Normal right ventricular systolic function Mild LAE Trivial PR mild MR and TR No AR Normal gradients; no valvular stenosis No pericardial effusion  LONG TERM CARDIAC EVENT MONITOR STUDY performed on 09/03/2021 Predominant underlying atrial fibrillation with a mean heart rate of 74 bpm; range 32-137 bpm.   There were infrequent PVCs present.   There were no patient triggered events noted on her study log.  DIAGNOSTIC RADIOGRAPHS OF LEFT KNEE 1-2 VIEWS performed on 08/05/2021 Narrowing of the medial cartilage space with bone-on-bone articulation and associated varus alignment. Osteophyte formation is noted. Subchondral sclerosis is noted. No evidence of fracture or dislocation.  Impression and Plan:   Meredith Reyes has been referred for pre-anesthesia review and clearance prior to her undergoing the planned anesthetic and procedural courses. Available labs, pertinent testing, and imaging results were personally reviewed by me. This patient has been appropriately cleared by cardiology with an overall LOW risk of significant perioperative cardiovascular complications.  Based on clinical review performed today (12/31/21), barring any significant acute changes in the patient's overall condition, it is anticipated that she will be able to proceed with the planned surgical intervention. Any acute changes in clinical condition may necessitate her procedure being postponed and/or cancelled. Patient will meet with anesthesia team (MD and/or CRNA) on the day of her procedure for preoperative evaluation/assessment. Questions regarding anesthetic course will be fielded at that time.   Pre-surgical instructions were reviewed with the patient during her PAT appointment and questions were fielded by PAT clinical staff. Patient was advised that if any questions or concerns arise prior to her procedure then she should return a call to PAT and/or her surgeon's office to discuss.  Honor Loh, MSN, APRN, FNP-C, CEN [facility]  Peri-operative Services Nurse Practitioner Phone: [phone] Fax: [phone] 12/31/21 3:49 PM  NOTE: This note has been prepared using Dragon dictation software. Despite my best ability to proofread, there is always the potential that unintentional transcriptional errors may still occur from this process.

## 2021-12-26 NOTE — Patient Instructions (Addendum)
Your procedure is scheduled on: 01/07/22 - Wednesday Report to the Registration Desk on the 1st floor of the [facility]. To find out your arrival time, please call [phone] between 1PM - 3PM on: 01/06/22 - Tuesday If your arrival time is 6:00 am, do not arrive prior to that time as the [facility] entrance doors do not open until 6:00 am.  REMEMBER: Instructions that are not followed completely may result in serious medical risk, up to and including death; or upon the discretion of your surgeon and anesthesiologist your surgery may need to be rescheduled.  Do not eat food after midnight the night before surgery.  No gum chewing, lozengers or hard candies.  You may however, drink CLEAR liquids up to 2 hours before you are scheduled to arrive for your surgery. Do not drink anything within 2 hours of your scheduled arrival time.  Clear liquids include: - water  - apple juice without pulp - gatorade (not RED colors) - black coffee or tea (Do NOT add milk or creamers to the coffee or tea) Do NOT drink anything that is not on this list.  In addition, your doctor has ordered for you to drink the provided  Ensure Pre-Surgery Clear Carbohydrate Drink  Drinking this carbohydrate drink up to two hours before surgery helps to reduce insulin resistance and improve patient outcomes. Please complete drinking 2 hours prior to scheduled arrival time.  TAKE THESE MEDICATIONS THE MORNING OF SURGERY WITH A SIP OF WATER: - carvedilol (COREG) 12.5 MG tablet - omeprazole (PRILOSEC) 20 MG capsule, (take one the night before and one on the morning of surgery - helps to prevent nausea after surgery.)  Stop taking Eliquis beginning 01/03/22.  One week prior to surgery: Stop Anti-inflammatories (NSAIDS) such as Advil, Aleve, Ibuprofen, Motrin, Naproxen, Naprosyn and Aspirin based products such as Excedrin, Goodys Powder, BC Powder.  Stop ANY OVER THE COUNTER supplements until after surgery.  You may  take Tylenol if needed for pain up until the day of surgery.  No Alcohol for 24 hours before or after surgery.  No Smoking including e-cigarettes for 24 hours prior to surgery.  No chewable tobacco products for at least 6 hours prior to surgery.  No nicotine patches on the day of surgery.  Do not use any "recreational" drugs for at least a week prior to your surgery.  Please be advised that the combination of cocaine and anesthesia may have negative outcomes, up to and including death. If you test positive for cocaine, your surgery will be cancelled.  On the morning of surgery brush your teeth with toothpaste and water, you may rinse your mouth with mouthwash if you wish. Do not swallow any toothpaste or mouthwash.  Use CHG Soap or wipes as directed on instruction sheet.  Do not wear jewelry, make-up, hairpins, clips or nail polish.  Do not wear lotions, powders, or perfumes.   Do not shave body from the neck down 48 hours prior to surgery just in case you cut yourself which could leave a site for infection.  Also, freshly shaved skin may become irritated if using the CHG soap.  Contact lenses, hearing aids and dentures may not be worn into surgery.  Do not bring valuables to the hospital. [facility] is not responsible for any missing/lost belongings or valuables.   Notify your doctor if there is any change in your medical condition (cold, fever, infection).  Wear comfortable clothing (specific to your surgery type) to the hospital.  After surgery, you can help prevent lung complications by doing breathing exercises.  Take deep breaths and cough every 1-2 hours. Your doctor may order a device called an Incentive Spirometer to help you take deep breaths. When coughing or sneezing, hold a pillow firmly against your incision with both hands. This is called "splinting." Doing this helps protect your incision. It also decreases belly discomfort.  If you are being admitted to the  hospital overnight, leave your suitcase in the car. After surgery it may be brought to your room.  If you are being discharged the day of surgery, you will not be allowed to drive home. You will need a responsible adult (18 years or older) to drive you home and stay with you that night.   If you are taking public transportation, you will need to have a responsible adult (18 years or older) with you. Please confirm with your physician that it is acceptable to use public transportation.   Please call the [facility] at [phone] if you have any questions about these instructions.  Surgery Visitation Policy:  Patients undergoing a surgery or procedure may have two family members or support persons with them as long as the person is not COVID-19 positive or experiencing its symptoms.   Inpatient Visitation:    Visiting hours are 7 a.m. to 8 p.m. Up to four visitors are allowed at one time in a patient room, including children. The visitors may rotate out with other people during the day. One designated support person (adult) may remain overnight.

## 2022-01-04 NOTE — H&P (Signed)
ORTHOPAEDIC HISTORY & PHYSICAL Meredith Reyes, Meredith Reyes, [place] - 12/25/2021 1:15 PM EDT Formatting of this note is different from the original. [facility] Chief Complaint:   Chief Complaint  Patient presents with   Knee Pain  H & P LEFT KNEE   History of Present Illness:   Meredith Reyes is a 66 y.o. female that presents to clinic today for her preoperative history and evaluation. Patient presents with her daughter. The patient is scheduled to undergo a left total knee arthroplasty on 01/07/22 by Dr. Ernest Reyes. Her pain began many years ago. The pain is located along the medial aspect of the knee. She describes her pain as worse with weightbearing. She reports associated swelling with some giving way of the knee. She denies associated numbness or tingling, denies locking.   The patient's symptoms have progressed to the point that they decrease her quality of life. The patient has previously undergone conservative treatment including NSAIDS and injections to the knee without adequate control of her symptoms.  Denies history of lumbar surgery, denies history of DVT. Does have recent diagnosis of atrial fibrillation and takes Eliquis.  Daughter and grandson will be available at home to assist postoperatively.  Past Medical, Surgical, Family, Social History, Allergies, Medications:   Past Medical History:  Past Medical History:  Diagnosis Date   HTN, goal below 140/90 11/27/2016   Hypercholesterolemia   Morbid obesity with BMI of 45.0-49.9, adult (CMS-HCC) 11/23/2014   Primary osteoarthritis of both knees 11/23/2014   Past Surgical History:  Past Surgical History:  Procedure Laterality Date   HYSTERECTOMY 1979   CESAREAN SECTION 1979   EGD 02/19/2020  Gastritis/Otherwise normal EGD bx/No Repeat/CTL   Right total knee arthroplasty using computer-assisted navigation 10/03/2021  Dr Meredith Reyes   JOINT REPLACEMENT   Current Medications:  Current Outpatient  Medications  Medication Sig Dispense Refill   acetaminophen (TYLENOL) 650 MG ER tablet Take 650 mg by mouth 2 (two) times daily as needed   apixaban (ELIQUIS) 5 mg tablet Take 1 tablet (5 mg total) by mouth 2 (two) times daily 60 tablet 11   carvediloL (COREG) 12.5 MG tablet TAKE 1 TABLET BY MOUTH TWICE DAILY WITH MEALS 180 tablet 0   metoprolol succinate (TOPROL-XL) 25 MG XL tablet Take 25 mg by mouth once daily   omeprazole (PRILOSEC) 20 MG DR capsule Take 20 mg by mouth once daily   sennosides-docusate (SENOKOT-S) 8.6-50 mg tablet Take 1 tablet by mouth 2 (two) times daily   No current facility-administered medications for this visit.   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  Tobacco Use   Smoking status: Never  Passive exposure: Yes   Smokeless tobacco: Never   Tobacco comments:  Second hand smoke exposure in childhood  Vaping Use   Vaping Use: Never used  Substance and Sexual Activity   Alcohol use: No   Drug use: Never   Family History:  Family History  Problem Relation Age of Onset   Diabetes type II Mother   Myocardial Infarction (Heart attack) Mother   Allergic rhinitis Mother   Coronary Artery Disease (Blocked arteries around heart) Father   Myocardial Infarction (Heart attack) Father   Colon cancer  Neg Hx   Colon polyps Neg Hx   Review of Systems:   A 10+ ROS was performed, reviewed, and the pertinent orthopaedic findings are documented in the HPI.   Physical Examination:   BP (!) 140/80 (BP Location: Left upper arm, Patient Position: Sitting, BP Cuff Size: Adult)  Ht 170.2 cm (5\' 7" )  Wt (!) 119.9 kg (264 lb 6.4 oz)  BMI 41.41 kg/m   Patient is a well-developed, well-nourished  female in no acute distress. Patient has normal mood and affect. Patient is alert and oriented to person, place, and time.   HEENT: Atraumatic, normocephalic. Pupils equal and reactive to light. Extraocular motion intact. Noninjected sclera.  Cardiovascular: Regular rate and rhythm, with no murmurs, rubs, or gallops. Distal pulses palpable.  Respiratory: Lungs clear to auscultation bilaterally.   Left  Knee:    Soft tissue swelling: mild    Effusion:                   minimal    Erythema:                 none    Crepitance:               moderate    Tenderness:             medial    Alignment:                relative varus    Mediolateral laxity:   medial pseudolaxity    Posterior sag:           negative    Patellar tracking:      Good tracking without evidence of subluxation or tilt    Atrophy:                    Generalized quadriceps atrophy.                                       Quadriceps tone was fair to good.    Range of motion:      0/15/87 degrees  Able to plantarflex and dorsiflex the left ankle. Able to flex and extend the toes.  Sensation intact over the saphenous, lateral sural cutaneous, superficial fibular, and deep fibular nerve distributions.  Tests Performed/Reviewed:  X-rays  3 views of the left knee were obtained. Images reveal severe loss of medial compartment joint space with osteophyte formation. No fractures or dislocations. No other osseous abnormality noted.  I personally ordered and interpreted today's x-rays.  Impression:   ICD-10-CM  1. Primary osteoarthritis of left knee M17.12   Plan:   The patient has end-stage degenerative changes of the left knee. It was explained to the patient that the condition is progressive in nature. Having failed conservative treatment, the patient has elected to proceed with a total joint arthroplasty. The patient will undergo a total joint arthroplasty with Dr. 11-18-1985. The risks of surgery, including blood clot and  infection, were discussed with the patient. Measures to reduce these risks, including the use of anticoagulation, perioperative antibiotics, and early ambulation were discussed. The importance of postoperative physical therapy was discussed with the patient. The patient elects to proceed with surgery. The patient is instructed to stop all blood thinners prior to surgery. The patient is instructed to call the hospital the day before surgery to learn of the proper arrival time.  Contact our office with any questions or concerns. Follow up as indicated, or sooner should any new problems arise, if conditions worsen, or if they are otherwise concerned.   Meredith Gardener, PA -C [facility] [address] [place], [place] [address] Phone: [phone]  This note was generated in part with voice recognition software and I apologize for any typographical errors that were not detected and corrected.  Electronically signed by Meredith Gardener, PA at 12/25/2021 5:40 PM EDT

## 2022-01-06 MED ORDER — CEFAZOLIN SODIUM-DEXTROSE 2-4 GM/100ML-% IV SOLN
2.0000 g | INTRAVENOUS | Status: AC
  Administered 2022-01-07: 2 g via INTRAVENOUS

## 2022-01-06 MED ORDER — GABAPENTIN 300 MG PO CAPS: 300.0000 mg | ORAL_CAPSULE | Freq: Once | ORAL | Status: AC

## 2022-01-06 MED ORDER — CHLORHEXIDINE GLUCONATE 4 % EX LIQD: 60.0000 mL | Freq: Once | CUTANEOUS | Status: DC

## 2022-01-06 MED ORDER — DEXAMETHASONE SODIUM PHOSPHATE 10 MG/ML IJ SOLN: 8.0000 mg | Freq: Once | INTRAMUSCULAR | Status: AC

## 2022-01-06 MED ORDER — CELECOXIB 200 MG PO CAPS: 400.0000 mg | ORAL_CAPSULE | Freq: Once | ORAL | Status: AC

## 2022-01-06 MED ORDER — ORAL CARE MOUTH RINSE: 15.0000 mL | Freq: Once | OROMUCOSAL | Status: AC

## 2022-01-06 MED ORDER — CHLORHEXIDINE GLUCONATE 0.12 % MT SOLN: 15.0000 mL | Freq: Once | OROMUCOSAL | Status: AC

## 2022-01-06 MED ORDER — LACTATED RINGERS IV SOLN: INTRAVENOUS | Status: DC

## 2022-01-06 MED ORDER — TRANEXAMIC ACID-NACL 1000-0.7 MG/100ML-% IV SOLN
1000.0000 mg | INTRAVENOUS | Status: AC
  Administered 2022-01-07: 1000 mg via INTRAVENOUS

## 2022-01-07 ENCOUNTER — Other Ambulatory Visit: Payer: Self-pay

## 2022-01-07 ENCOUNTER — Ambulatory Visit: Payer: Medicare HMO | Admitting: Urgent Care

## 2022-01-07 ENCOUNTER — Observation Stay: Payer: Medicare HMO

## 2022-01-07 ENCOUNTER — Encounter
Admission: RE | Disposition: A | Discharge status: Home Health | Payer: Self-pay | Source: Home / Self Care | Attending: Orthopedic Surgery

## 2022-01-07 ENCOUNTER — Observation Stay
Admission: RE | Admit: 2022-01-07 | Discharge: 2022-01-08 | Disposition: A | Discharge status: Home Health | Payer: Medicare HMO | Attending: Orthopedic Surgery | Admitting: Orthopedic Surgery

## 2022-01-07 ENCOUNTER — Encounter: Payer: Self-pay | Admitting: Orthopedic Surgery

## 2022-01-07 DIAGNOSIS — M1712 Unilateral primary osteoarthritis, left knee: Principal | ICD-10-CM | POA: Insufficient documentation

## 2022-01-07 DIAGNOSIS — I4819 Other persistent atrial fibrillation: Secondary | ICD-10-CM | POA: Insufficient documentation

## 2022-01-07 DIAGNOSIS — Z7901 Long term (current) use of anticoagulants: Secondary | ICD-10-CM | POA: Diagnosis not present

## 2022-01-07 DIAGNOSIS — Z96659 Presence of unspecified artificial knee joint: Secondary | ICD-10-CM

## 2022-01-07 DIAGNOSIS — I10 Essential (primary) hypertension: Secondary | ICD-10-CM | POA: Diagnosis not present

## 2022-01-07 DIAGNOSIS — Z96651 Presence of right artificial knee joint: Secondary | ICD-10-CM | POA: Diagnosis not present

## 2022-01-07 DIAGNOSIS — Z471 Aftercare following joint replacement surgery: Secondary | ICD-10-CM | POA: Diagnosis not present

## 2022-01-07 DIAGNOSIS — R7303 Prediabetes: Secondary | ICD-10-CM

## 2022-01-07 DIAGNOSIS — K219 Gastro-esophageal reflux disease without esophagitis: Secondary | ICD-10-CM | POA: Diagnosis not present

## 2022-01-07 DIAGNOSIS — Z96642 Presence of left artificial hip joint: Secondary | ICD-10-CM | POA: Diagnosis not present

## 2022-01-07 DIAGNOSIS — Z96652 Presence of left artificial knee joint: Secondary | ICD-10-CM | POA: Diagnosis not present

## 2022-01-07 DIAGNOSIS — Z79899 Other long term (current) drug therapy: Secondary | ICD-10-CM | POA: Insufficient documentation

## 2022-01-07 DIAGNOSIS — E7800 Pure hypercholesterolemia, unspecified: Secondary | ICD-10-CM

## 2022-01-07 DIAGNOSIS — R600 Localized edema: Secondary | ICD-10-CM | POA: Diagnosis not present

## 2022-01-07 HISTORY — DX: Hyperlipidemia, unspecified: E78.5

## 2022-01-07 HISTORY — PX: KNEE ARTHROPLASTY: SHX992

## 2022-01-07 HISTORY — DX: Prediabetes: R73.03

## 2022-01-07 HISTORY — DX: Long term (current) use of anticoagulants: Z79.01

## 2022-01-07 SURGERY — ARTHROPLASTY, KNEE, TOTAL, USING IMAGELESS COMPUTER-ASSISTED NAVIGATION
Anesthesia: Spinal | Site: Knee | Laterality: Left

## 2022-01-07 MED ORDER — DEXAMETHASONE SODIUM PHOSPHATE 10 MG/ML IJ SOLN
INTRAMUSCULAR | Status: AC
  Administered 2022-01-07: 8 mg via INTRAVENOUS
  Filled 2022-01-07: qty 1

## 2022-01-07 MED ORDER — 0.9 % SODIUM CHLORIDE (POUR BTL) OPTIME
TOPICAL | Status: DC | PRN
  Administered 2022-01-07: 500 mL

## 2022-01-07 MED ORDER — OXYCODONE HCL 5 MG PO TABS: 5.0000 mg | ORAL_TABLET | ORAL | Status: DC | PRN

## 2022-01-07 MED ORDER — CELECOXIB 200 MG PO CAPS
ORAL_CAPSULE | ORAL | Status: AC
  Administered 2022-01-07: 400 mg via ORAL
  Filled 2022-01-07: qty 2

## 2022-01-07 MED ORDER — SEVOFLURANE IN SOLN
RESPIRATORY_TRACT | Status: AC
  Filled 2022-01-07: qty 250

## 2022-01-07 MED ORDER — OXYCODONE HCL 5 MG/5ML PO SOLN: 5.0000 mg | Freq: Once | ORAL | Status: DC | PRN

## 2022-01-07 MED ORDER — CARVEDILOL 12.5 MG PO TABS
12.5000 mg | ORAL_TABLET | Freq: Two times a day (BID) | ORAL | Status: DC
  Administered 2022-01-08: 12.5 mg via ORAL
  Filled 2022-01-07: qty 1

## 2022-01-07 MED ORDER — TRANEXAMIC ACID-NACL 1000-0.7 MG/100ML-% IV SOLN
INTRAVENOUS | Status: AC
  Administered 2022-01-07: 1000 mg via INTRAVENOUS
  Filled 2022-01-07: qty 100

## 2022-01-07 MED ORDER — ONDANSETRON HCL 4 MG/2ML IJ SOLN
INTRAMUSCULAR | Status: DC | PRN
  Administered 2022-01-07: 4 mg via INTRAVENOUS

## 2022-01-07 MED ORDER — PANTOPRAZOLE SODIUM 40 MG PO TBEC
40.0000 mg | DELAYED_RELEASE_TABLET | Freq: Two times a day (BID) | ORAL | Status: DC
  Administered 2022-01-07 – 2022-01-08 (×2): 40 mg via ORAL
  Filled 2022-01-07 (×2): qty 1

## 2022-01-07 MED ORDER — SODIUM CHLORIDE (PF) 0.9 % IJ SOLN
INTRAMUSCULAR | Status: DC | PRN
  Administered 2022-01-07: 120 mL via INTRAMUSCULAR

## 2022-01-07 MED ORDER — ACETAMINOPHEN 10 MG/ML IV SOLN
INTRAVENOUS | Status: AC
  Filled 2022-01-07: qty 100

## 2022-01-07 MED ORDER — FENTANYL CITRATE (PF) 100 MCG/2ML IJ SOLN: 25.0000 ug | INTRAMUSCULAR | Status: DC | PRN

## 2022-01-07 MED ORDER — METOPROLOL SUCCINATE ER 25 MG PO TB24: 25.0000 mg | ORAL_TABLET | Freq: Every day | ORAL | Status: DC

## 2022-01-07 MED ORDER — PHENYLEPHRINE HCL-NACL 20-0.9 MG/250ML-% IV SOLN
INTRAVENOUS | Status: DC | PRN
  Administered 2022-01-07: 50 ug/min via INTRAVENOUS

## 2022-01-07 MED ORDER — OXYCODONE HCL 5 MG PO TABS: 10.0000 mg | ORAL_TABLET | ORAL | Status: DC | PRN

## 2022-01-07 MED ORDER — TRANEXAMIC ACID-NACL 1000-0.7 MG/100ML-% IV SOLN: 1000.0000 mg | Freq: Once | INTRAVENOUS | Status: AC

## 2022-01-07 MED ORDER — PROPOFOL 500 MG/50ML IV EMUL
INTRAVENOUS | Status: DC | PRN
  Administered 2022-01-07: 200 ug/kg/min via INTRAVENOUS

## 2022-01-07 MED ORDER — METOCLOPRAMIDE HCL 10 MG PO TABS
10.0000 mg | ORAL_TABLET | Freq: Three times a day (TID) | ORAL | Status: DC
  Administered 2022-01-07 – 2022-01-08 (×3): 10 mg via ORAL
  Filled 2022-01-07 (×7): qty 1

## 2022-01-07 MED ORDER — ALUM & MAG HYDROXIDE-SIMETH 200-200-20 MG/5ML PO SUSP: 30.0000 mL | ORAL | Status: DC | PRN

## 2022-01-07 MED ORDER — GABAPENTIN 300 MG PO CAPS
ORAL_CAPSULE | ORAL | Status: AC
  Administered 2022-01-07: 300 mg via ORAL
  Filled 2022-01-07: qty 1

## 2022-01-07 MED ORDER — DIPHENHYDRAMINE HCL 12.5 MG/5ML PO ELIX: 12.5000 mg | ORAL_SOLUTION | ORAL | Status: DC | PRN

## 2022-01-07 MED ORDER — ONDANSETRON HCL 4 MG/2ML IJ SOLN: 4.0000 mg | Freq: Four times a day (QID) | INTRAMUSCULAR | Status: DC | PRN

## 2022-01-07 MED ORDER — SURGIPHOR WOUND IRRIGATION SYSTEM - OPTIME
TOPICAL | Status: DC | PRN
  Administered 2022-01-07: 1

## 2022-01-07 MED ORDER — BUPIVACAINE LIPOSOME 1.3 % IJ SUSP
INTRAMUSCULAR | Status: AC
  Filled 2022-01-07: qty 20

## 2022-01-07 MED ORDER — CEFAZOLIN SODIUM-DEXTROSE 2-4 GM/100ML-% IV SOLN
INTRAVENOUS | Status: AC
  Filled 2022-01-07: qty 100

## 2022-01-07 MED ORDER — GLYCOPYRROLATE 0.2 MG/ML IJ SOLN
INTRAMUSCULAR | Status: DC | PRN
  Administered 2022-01-07: .2 mg via INTRAVENOUS

## 2022-01-07 MED ORDER — SODIUM CHLORIDE FLUSH 0.9 % IV SOLN
INTRAVENOUS | Status: AC
  Filled 2022-01-07: qty 40

## 2022-01-07 MED ORDER — FLEET ENEMA 7-19 GM/118ML RE ENEM: 1.0000 | ENEMA | Freq: Once | RECTAL | Status: DC | PRN

## 2022-01-07 MED ORDER — MIDAZOLAM HCL 5 MG/5ML IJ SOLN
INTRAMUSCULAR | Status: DC | PRN
  Administered 2022-01-07: 2 mg via INTRAVENOUS

## 2022-01-07 MED ORDER — ACETAMINOPHEN 10 MG/ML IV SOLN
1000.0000 mg | Freq: Four times a day (QID) | INTRAVENOUS | Status: DC
  Administered 2022-01-07 – 2022-01-08 (×3): 1000 mg via INTRAVENOUS
  Filled 2022-01-07 (×4): qty 100

## 2022-01-07 MED ORDER — CHLORHEXIDINE GLUCONATE 0.12 % MT SOLN
OROMUCOSAL | Status: AC
  Administered 2022-01-07: 15 mL via OROMUCOSAL
  Filled 2022-01-07: qty 15

## 2022-01-07 MED ORDER — HYDROMORPHONE HCL 1 MG/ML IJ SOLN: 0.5000 mg | INTRAMUSCULAR | Status: DC | PRN

## 2022-01-07 MED ORDER — FERROUS SULFATE 325 (65 FE) MG PO TABS
325.0000 mg | ORAL_TABLET | Freq: Two times a day (BID) | ORAL | Status: DC
  Administered 2022-01-08: 325 mg via ORAL
  Filled 2022-01-07: qty 1

## 2022-01-07 MED ORDER — ENSURE PRE-SURGERY PO LIQD
296.0000 mL | Freq: Once | ORAL | Status: AC
  Administered 2022-01-07: 296 mL via ORAL

## 2022-01-07 MED ORDER — PROPOFOL 1000 MG/100ML IV EMUL
INTRAVENOUS | Status: AC
  Filled 2022-01-07: qty 100

## 2022-01-07 MED ORDER — TRAMADOL HCL 50 MG PO TABS: 50.0000 mg | ORAL_TABLET | ORAL | Status: DC | PRN

## 2022-01-07 MED ORDER — BUPIVACAINE HCL (PF) 0.5 % IJ SOLN
INTRAMUSCULAR | Status: DC | PRN
  Administered 2022-01-07: 3 mL

## 2022-01-07 MED ORDER — PHENOL 1.4 % MT LIQD
1.0000 | OROMUCOSAL | Status: DC | PRN
Start: 2022-01-07 — End: 2022-01-08

## 2022-01-07 MED ORDER — ACETAMINOPHEN 325 MG PO TABS: 325.0000 mg | ORAL_TABLET | Freq: Four times a day (QID) | ORAL | Status: DC | PRN

## 2022-01-07 MED ORDER — SODIUM CHLORIDE 0.9 % IV SOLN: INTRAVENOUS | Status: DC

## 2022-01-07 MED ORDER — BISACODYL 10 MG RE SUPP: 10.0000 mg | Freq: Every day | RECTAL | Status: DC | PRN

## 2022-01-07 MED ORDER — SODIUM CHLORIDE 0.9 % IR SOLN
Status: DC | PRN
  Administered 2022-01-07: 3000 mL

## 2022-01-07 MED ORDER — OXYCODONE HCL 5 MG PO TABS: 5.0000 mg | ORAL_TABLET | Freq: Once | ORAL | Status: DC | PRN

## 2022-01-07 MED ORDER — ONDANSETRON HCL 4 MG/2ML IJ SOLN: 4.0000 mg | Freq: Once | INTRAMUSCULAR | Status: DC | PRN

## 2022-01-07 MED ORDER — BUPIVACAINE HCL (PF) 0.25 % IJ SOLN
INTRAMUSCULAR | Status: AC
  Filled 2022-01-07: qty 60

## 2022-01-07 MED ORDER — SENNOSIDES-DOCUSATE SODIUM 8.6-50 MG PO TABS
1.0000 | ORAL_TABLET | Freq: Two times a day (BID) | ORAL | Status: DC
  Administered 2022-01-07 – 2022-01-08 (×2): 1 via ORAL
  Filled 2022-01-07 (×2): qty 1

## 2022-01-07 MED ORDER — MENTHOL 3 MG MT LOZG: 1.0000 | LOZENGE | OROMUCOSAL | Status: DC | PRN

## 2022-01-07 MED ORDER — CELECOXIB 200 MG PO CAPS
200.0000 mg | ORAL_CAPSULE | Freq: Two times a day (BID) | ORAL | Status: DC
  Administered 2022-01-07 – 2022-01-08 (×2): 200 mg via ORAL
  Filled 2022-01-07 (×2): qty 1

## 2022-01-07 MED ORDER — ONDANSETRON HCL 4 MG PO TABS: 4.0000 mg | ORAL_TABLET | Freq: Four times a day (QID) | ORAL | Status: DC | PRN

## 2022-01-07 MED ORDER — MAGNESIUM HYDROXIDE 400 MG/5ML PO SUSP
30.0000 mL | Freq: Every day | ORAL | Status: DC
  Administered 2022-01-07 – 2022-01-08 (×2): 30 mL via ORAL
  Filled 2022-01-07 (×2): qty 30

## 2022-01-07 MED ORDER — CEFAZOLIN SODIUM-DEXTROSE 2-4 GM/100ML-% IV SOLN
2.0000 g | Freq: Four times a day (QID) | INTRAVENOUS | Status: AC
  Administered 2022-01-07 – 2022-01-08 (×2): 2 g via INTRAVENOUS
  Filled 2022-01-07 (×2): qty 100

## 2022-01-07 MED ORDER — ACETAMINOPHEN 10 MG/ML IV SOLN: 1000.0000 mg | Freq: Once | INTRAVENOUS | Status: DC | PRN

## 2022-01-07 MED ORDER — ACETAMINOPHEN 10 MG/ML IV SOLN
INTRAVENOUS | Status: DC | PRN
  Administered 2022-01-07: 1000 mg via INTRAVENOUS

## 2022-01-07 MED ORDER — TRANEXAMIC ACID-NACL 1000-0.7 MG/100ML-% IV SOLN
INTRAVENOUS | Status: AC
  Filled 2022-01-07: qty 100

## 2022-01-07 MED ORDER — MIDAZOLAM HCL 2 MG/2ML IJ SOLN
INTRAMUSCULAR | Status: AC
  Filled 2022-01-07: qty 2

## 2022-01-07 MED ORDER — APIXABAN 5 MG PO TABS
5.0000 mg | ORAL_TABLET | Freq: Two times a day (BID) | ORAL | Status: DC
Start: 2022-01-08 — End: 2022-01-08
  Administered 2022-01-08: 5 mg via ORAL
  Filled 2022-01-07: qty 1

## 2022-01-07 SURGICAL SUPPLY — 76 items
ATTUNE MED DOME PAT 38 KNEE (Knees) ×1 IMPLANT
ATTUNE PSFEM LTSZ6 NARCEM KNEE (Femur) ×1 IMPLANT
ATTUNE PSRP INSR SZ6 5 KNEE (Insert) ×1 IMPLANT
BASE TIBIAL ROT PLAT SZ 5 KNEE (Knees) IMPLANT
BATTERY INSTRU NAVIGATION (MISCELLANEOUS) ×8 IMPLANT
BLADE SAW 70X12.5 (BLADE) ×2 IMPLANT
BLADE SAW 90X13X1.19 OSCILLAT (BLADE) ×2 IMPLANT
BLADE SAW 90X25X1.19 OSCILLAT (BLADE) ×2 IMPLANT
BONE CEMENT GENTAMICIN (Cement) ×4 IMPLANT
CEMENT BONE GENTAMICIN 40 (Cement) IMPLANT
COOLER POLAR GLACIER W/PUMP (MISCELLANEOUS) ×2 IMPLANT
CUFF TOURN SGL QUICK 24 (TOURNIQUET CUFF)
CUFF TOURN SGL QUICK 34 (TOURNIQUET CUFF)
CUFF TRNQT CYL 24X4X16.5-23 (TOURNIQUET CUFF) IMPLANT
CUFF TRNQT CYL 34X4.125X (TOURNIQUET CUFF) IMPLANT
DRAPE 3/4 80X56 (DRAPES) ×2 IMPLANT
DRAPE INCISE IOBAN 66X45 STRL (DRAPES) IMPLANT
DRSG DERMACEA 8X12 NADH (GAUZE/BANDAGES/DRESSINGS) ×2 IMPLANT
DRSG MEPILEX SACRM 8.7X9.8 (GAUZE/BANDAGES/DRESSINGS) ×2 IMPLANT
DRSG OPSITE POSTOP 4X14 (GAUZE/BANDAGES/DRESSINGS) ×2 IMPLANT
DRSG TEGADERM 4X4.75 (GAUZE/BANDAGES/DRESSINGS) ×2 IMPLANT
DURAPREP 26ML APPLICATOR (WOUND CARE) ×4 IMPLANT
ELECT CAUTERY BLADE 6.4 (BLADE) ×2 IMPLANT
ELECT REM PT RETURN 9FT ADLT (ELECTROSURGICAL) ×2
ELECTRODE REM PT RTRN 9FT ADLT (ELECTROSURGICAL) ×1 IMPLANT
EX-PIN ORTHOLOCK NAV 4X150 (PIN) ×4 IMPLANT
GLOVE BIOGEL M STRL SZ7.5 (GLOVE) ×8 IMPLANT
GLOVE BIOGEL PI IND STRL 8 (GLOVE) ×1 IMPLANT
GLOVE BIOGEL PI INDICATOR 8 (GLOVE) ×1
GLOVE SURG UNDER POLY LF SZ7.5 (GLOVE) ×2 IMPLANT
GOWN STRL REUS W/ TWL LRG LVL3 (GOWN DISPOSABLE) ×2 IMPLANT
GOWN STRL REUS W/ TWL XL LVL3 (GOWN DISPOSABLE) ×1 IMPLANT
GOWN STRL REUS W/TWL LRG LVL3 (GOWN DISPOSABLE) ×2
GOWN STRL REUS W/TWL XL LVL3 (GOWN DISPOSABLE) ×1
HEMOVAC 400CC 10FR (MISCELLANEOUS) ×2 IMPLANT
HOLDER FOLEY CATH W/STRAP (MISCELLANEOUS) ×2 IMPLANT
HOLSTER ELECTROSUGICAL PENCIL (MISCELLANEOUS) ×1 IMPLANT
HOOD PEEL AWAY FLYTE STAYCOOL (MISCELLANEOUS) ×4 IMPLANT
IV NS IRRIG 3000ML ARTHROMATIC (IV SOLUTION) ×2 IMPLANT
KIT TURNOVER KIT A (KITS) ×2 IMPLANT
KNIFE SCULPS 14X20 (INSTRUMENTS) ×2 IMPLANT
MANIFOLD NEPTUNE II (INSTRUMENTS) ×4 IMPLANT
NDL SPNL 20GX3.5 QUINCKE YW (NEEDLE) ×2 IMPLANT
NEEDLE SPNL 20GX3.5 QUINCKE YW (NEEDLE) ×4 IMPLANT
NS IRRIG 500ML POUR BTL (IV SOLUTION) ×2 IMPLANT
PACK TOTAL KNEE (MISCELLANEOUS) ×2 IMPLANT
PAD ABD DERMACEA PRESS 5X9 (GAUZE/BANDAGES/DRESSINGS) ×4 IMPLANT
PAD WRAPON POLAR KNEE (MISCELLANEOUS) ×1 IMPLANT
PAD WRAPON POLOR MULTI XL (MISCELLANEOUS) IMPLANT
PIN DRILL FIX HALF THREAD (BIT) ×4 IMPLANT
PIN FIXATION 1/8DIA X 3INL (PIN) ×2 IMPLANT
PULSAVAC PLUS IRRIG FAN TIP (DISPOSABLE) ×2
SOL PREP PVP 2OZ (MISCELLANEOUS) ×2
SOLUTION IRRIG SURGIPHOR (IV SOLUTION) ×2 IMPLANT
SOLUTION PREP PVP 2OZ (MISCELLANEOUS) ×1 IMPLANT
SPONGE DRAIN TRACH 4X4 STRL 2S (GAUZE/BANDAGES/DRESSINGS) ×2 IMPLANT
SPONGE T-LAP 18X18 ~~LOC~~+RFID (SPONGE) ×1 IMPLANT
STAPLER SKIN PROX 35W (STAPLE) ×2 IMPLANT
STOCKINETTE IMPERV 14X48 (MISCELLANEOUS) IMPLANT
STRAP TIBIA SHORT (MISCELLANEOUS) ×2 IMPLANT
SUCTION FRAZIER HANDLE 10FR (MISCELLANEOUS) ×1
SUCTION TUBE FRAZIER 10FR DISP (MISCELLANEOUS) ×1 IMPLANT
SUT VIC AB 0 CT1 36 (SUTURE) ×4 IMPLANT
SUT VIC AB 1 CT1 36 (SUTURE) ×4 IMPLANT
SUT VIC AB 2-0 CT2 27 (SUTURE) ×2 IMPLANT
SYR 30ML LL (SYRINGE) ×4 IMPLANT
TIBIAL BASE ROT PLAT SZ 5 KNEE (Knees) ×2 IMPLANT
TIP FAN IRRIG PULSAVAC PLUS (DISPOSABLE) ×1 IMPLANT
TOWEL OR 17X26 4PK STRL BLUE (TOWEL DISPOSABLE) IMPLANT
TOWER CARTRIDGE SMART MIX (DISPOSABLE) ×2 IMPLANT
TRAY FOLEY MTR SLVR 16FR STAT (SET/KITS/TRAYS/PACK) ×2 IMPLANT
WATER STERILE IRR 1000ML POUR (IV SOLUTION) ×1 IMPLANT
WATER STERILE IRR 500ML POUR (IV SOLUTION) ×1 IMPLANT
WRAP-ON POLOR PAD MULTI XL (MISCELLANEOUS) ×1
WRAPON POLAR PAD KNEE (MISCELLANEOUS)
WRAPON POLOR PAD MULTI XL (MISCELLANEOUS) ×1

## 2022-01-07 NOTE — H&P (Signed)
The patient has been re-examined, and the chart reviewed, and there have been no interval changes to the documented history and physical.    The risks, benefits, and alternatives have been discussed at length. The patient expressed understanding of the risks benefits and agreed with plans for surgical intervention.  Arvine Clayburn P. Ming Mcmannis, Jr. M.D.    

## 2022-01-07 NOTE — Op Note (Signed)
OPERATIVE NOTE  DATE OF SURGERY:  01/07/2022  PATIENT NAME:  Meredith Reyes   DOB: Nov 26, 1955  MRN: [id number]  PRE-OPERATIVE DIAGNOSIS: Degenerative arthrosis of the left knee, primary  POST-OPERATIVE DIAGNOSIS:  Same  PROCEDURE:  Left total knee arthroplasty using computer-assisted navigation  SURGEON:  Marciano Sequin. M.D.  ASSISTANT: Cassell Smiles, PA-C (present and scrubbed throughout the case, critical for assistance with exposure, retraction, instrumentation, and closure)  ANESTHESIA: spinal  ESTIMATED BLOOD LOSS: 100 mL  FLUIDS REPLACED: 1300 mL of crystalloid  TOURNIQUET TIME: 124 minutes  DRAINS: 2 medium Hemovac drains  SOFT TISSUE RELEASES: Anterior cruciate ligament, posterior cruciate ligament, deep medial collateral ligament, patellofemoral ligament  IMPLANTS UTILIZED: DePuy Attune size 6N posterior stabilized femoral component (cemented), size 5 rotating platform tibial component (cemented), 38 mm medialized dome patella (cemented), and a 5 mm stabilized rotating platform polyethylene insert.  INDICATIONS FOR SURGERY: Meredith Reyes is a 66 y.o. year old female with a long history of progressive knee pain. X-rays demonstrated severe degenerative changes in tricompartmental fashion. The patient had not seen any significant improvement despite conservative nonsurgical intervention. After discussion of the risks and benefits of surgical intervention, the patient expressed understanding of the risks benefits and agree with plans for total knee arthroplasty.   The risks, benefits, and alternatives were discussed at length including but not limited to the risks of infection, bleeding, nerve injury, stiffness, blood clots, the need for revision surgery, cardiopulmonary complications, among others, and they were willing to proceed.  PROCEDURE IN DETAIL: The patient was brought into the operating room and, after adequate spinal anesthesia was achieved, a tourniquet was placed  on the patient's upper thigh. The patient's knee and leg were cleaned and prepped with alcohol and DuraPrep and draped in the usual sterile fashion. A "timeout" was performed as per usual protocol. The lower extremity was exsanguinated using an Esmarch, and the tourniquet was inflated to 300 mmHg. An anterior longitudinal incision was made followed by a standard mid vastus approach. The deep fibers of the medial collateral ligament were elevated in a subperiosteal fashion off of the medial flare of the tibia so as to maintain a continuous soft tissue sleeve. The patella was subluxed laterally and the patellofemoral ligament was incised. Inspection of the knee demonstrated severe degenerative changes with full-thickness loss of articular cartilage. Osteophytes were debrided using a rongeur. Anterior and posterior cruciate ligaments were excised. Two 4.0 mm Schanz pins were inserted in the femur and into the tibia for attachment of the array of trackers used for computer-assisted navigation. Hip center was identified using a circumduction technique. Distal landmarks were mapped using the computer. The distal femur and proximal tibia were mapped using the computer. Attention was then directed to the proximal tibia. Medial and lateral menisci were excised. The extramedullary tibial cutting guide was positioned using computer-assisted navigation so as to achieve a 0 varus-valgus alignment and 3 posterior slope. The cut was performed and verified using the computer. The distal femoral cutting guide was positioned using computer-assisted navigation so as to achieve a 5 distal valgus cut. The femur was sized and it was felt that a size 6N femoral component was appropriate. A size 6 femoral cutting guide was positioned and the anterior cut was performed and verified using the computer. This was followed by completion of the posterior and chamfer cuts. Femoral cutting guide for the central box was then positioned in the  center box cut was performed. The proximal tibia was sized  and it was felt that a size 5 tibial tray was appropriate. Tibial and femoral trials were inserted followed by insertion of a 5 mm polyethylene insert. This allowed for excellent mediolateral soft tissue balancing both in flexion and in full extension. Finally, the patella was cut and prepared so as to accommodate a 38 mm medialized dome patella. A patella trial was placed and the knee was placed through a range of motion with excellent patellar tracking appreciated. The femoral trial was removed after debridement of posterior osteophytes. The central post-hole for the tibial component was reamed followed by insertion of a keel punch. Tibial trials were then removed. Cut surfaces of bone were irrigated with copious amounts of normal saline using pulsatile lavage and then suctioned dry. Polymethylmethacrylate cement with gentamicin was prepared in the usual fashion using a vacuum mixer. Cement was applied to the cut surface of the proximal tibia as well as along the undersurface of a size 5 rotating platform tibial component. Tibial component was positioned and impacted into place. Excess cement was removed using [employer] fast streamer. Cement was then applied to the cut surfaces of the femur as well as along the posterior flanges of the size 6N femoral component. The femoral component was positioned and impacted into place. Excess cement was removed using [employer] fast streamer. A 5 mm polyethylene trial was inserted and the knee was brought into full extension with steady axial compression applied. Finally, cement was applied to the backside of a 38 mm medialized dome patella and the patellar component was positioned and patellar clamp applied. Excess cement was removed using [employer] fast streamer. After adequate curing of the cement, the tourniquet was deflated after a total tourniquet time of 124 minutes. Hemostasis was achieved using electrocautery. The knee was irrigated  with copious amounts of normal saline using pulsatile lavage followed by 450 ml of Surgiphor and then suctioned dry. 20 mL of 1.3% Exparel and 60 mL of 0.25% Marcaine in 40 mL of normal saline was injected along the posterior capsule, medial and lateral gutters, and along the arthrotomy site. A 5 mm stabilized rotating platform polyethylene insert was inserted and the knee was placed through a range of motion with excellent mediolateral soft tissue balancing appreciated and excellent patellar tracking noted. 2 medium drains were placed in the wound bed and brought out through separate stab incisions. The medial parapatellar portion of the incision was reapproximated using interrupted sutures of #1 Vicryl. Subcutaneous tissue was approximated in layers using first #0 Vicryl followed #2-0 Vicryl. The skin was approximated with skin staples. A sterile dressing was applied.  The patient tolerated the procedure well and was transported to the recovery room in stable condition.    Lawarence Meek P. Holley Bouche., M.D.

## 2022-01-07 NOTE — Anesthesia Procedure Notes (Signed)
Date/Time: 01/07/2022 12:38 PM Performed by: Junious Silk, CRNA Pre-anesthesia Checklist: Patient identified, Emergency Drugs available, Suction available, Patient being monitored and Timeout performed Oxygen Delivery Method: Simple face mask

## 2022-01-07 NOTE — Anesthesia Procedure Notes (Signed)
Spinal  Patient location during procedure: OR Start time: 01/07/2022 12:22 PM End time: 01/07/2022 12:28 PM Reason for block: surgical anesthesia Staffing Performed: resident/CRNA  Anesthesiologist: Piscitello, Precious Haws, MD Resident/CRNA: Nelda Marseille, CRNA Preanesthetic Checklist Completed: patient identified, IV checked, site marked, risks and benefits discussed, surgical consent, monitors and equipment checked, pre-op evaluation and timeout performed Spinal Block Patient position: sitting Prep: ChloraPrep Patient monitoring: heart rate, continuous pulse ox, blood pressure and cardiac monitor Approach: midline Location: L3-4 Injection technique: single-shot Needle Needle type: Whitacre and Introducer  Needle gauge: 24 G Needle length: 9 cm Assessment Sensory level: T10 Events: CSF return Additional Notes Sterile aseptic technique used throughout the procedure.  Negative paresthesia. Negative blood return. Positive free-flowing CSF. Expiration date of kit checked and confirmed. Patient tolerated procedure well, without complications.

## 2022-01-07 NOTE — Anesthesia Preprocedure Evaluation (Addendum)
Anesthesia Evaluation  Patient identified by MRN, date of birth, ID band Patient awake  General Assessment Comment:  Had other knee replaced under spinal few months ago without issues.  Reviewed: Allergy & Precautions, NPO status , Patient's Chart, lab work & pertinent test results  History of Anesthesia Complications Negative for: history of anesthetic complications  Airway Mallampati: II  TM Distance: >3 FB Neck ROM: Full    Dental no notable dental hx. (+) Teeth Intact   Pulmonary neg pulmonary ROS, neg sleep apnea, neg COPD, Patient abstained from smoking.Not current smoker,    Pulmonary exam normal breath sounds clear to auscultation       Cardiovascular Exercise Tolerance: Good METShypertension, Pt. on medications (-) CAD and (-) Past MI + dysrhythmias Atrial Fibrillation  Rhythm:Regular Rate:Normal - Systolic murmurs ? Long-term cardiac event monitor study performed on 09/03/2021 revealing a predominant underlying atrial fibrillation with a mean heart rate of 74 bpm; range 32-137 bpm. There were infrequent PVCs present.  There were no patient triggered events noted on her study log.  ? TTE performed on 09/22/2021 revealed a low normal left ventricular systolic function with an EF of 50%.  Left atrium was mildly enlarged.  There was trivial pulmonic and mild mitral/tricuspid valve regurgitation.  There was no evidence of a significant transvalvular gradient to suggest stenosis.   Neuro/Psych negative neurological ROS  negative psych ROS   GI/Hepatic GERD  Medicated and Controlled,(+)     (-) substance abuse  ,   Endo/Other  neg diabetesMorbid obesity  Renal/GU negative Renal ROS     Musculoskeletal  (+) Arthritis ,   Abdominal (+) + obese,   Peds  Hematology   Anesthesia Other Findings Past Medical History: No date: Arthritis 08/29/2021: Atrial fibrillation (HCC)     Comment:  a.) new onset; discovered on  preop ECG 08/29/2021. b.)               CHA2DS2-VASc = 3 (age, sex, HTN). c.) rate/rhythm               maintained with oral carvedilol; on chronic               anticoagulation using apixaban (started 10/2021). No date: GERD (gastroesophageal reflux disease) No date: HLD (hyperlipidemia) No date: Hypertension No date: Long term current use of anticoagulant     Comment:  a.) apixaban No date: Prediabetes  Reproductive/Obstetrics                            Anesthesia Physical Anesthesia Plan  ASA: 3  Anesthesia Plan: Spinal   Post-op Pain Management: Gabapentin PO (pre-op)*, Celebrex PO (pre-op)* and Ofirmev IV (intra-op)*   Induction: Intravenous  PONV Risk Score and Plan: 2 and Ondansetron, Dexamethasone, Propofol infusion, TIVA, Midazolam and Treatment may vary due to age or medical condition  Airway Management Planned: Natural Airway  Additional Equipment: None  Intra-op Plan:   Post-operative Plan:   Informed Consent: I have reviewed the patients History and Physical, chart, labs and discussed the procedure including the risks, benefits and alternatives for the proposed anesthesia with the patient or authorized representative who has indicated his/her understanding and acceptance.       Plan Discussed with: CRNA and Surgeon  Anesthesia Plan Comments: (Discussed R/B/A of neuraxial anesthesia technique with patient: - rare risks of spinal/epidural hematoma, nerve damage, infection - Risk of PDPH - Risk of nausea and vomiting - Risk of conversion  to general anesthesia and its associated risks, including sore throat, damage to lips/eyes/teeth/oropharynx, and rare risks such as cardiac and respiratory events. - Risk of allergic reactions  Discussed the role of CRNA in patient's perioperative care.  Patient voiced understanding.)        Anesthesia Quick Evaluation

## 2022-01-07 NOTE — Transfer of Care (Signed)
Immediate Anesthesia Transfer of Care Note  Patient: Meredith Reyes  Procedure(s) Performed: COMPUTER ASSISTED TOTAL KNEE ARTHROPLASTY (Left: Knee)  Patient Location: PACU  Anesthesia Type:Spinal  Level of Consciousness: sedated  Airway & Oxygen Therapy: Patient Spontanous Breathing and Patient connected to face mask oxygen  Post-op Assessment: Report given to RN and Post -op Vital signs reviewed and stable  Post vital signs: Reviewed and stable  Last Vitals:  Vitals Value Taken Time  BP 118/65 01/07/22 1646  Temp    Pulse 64 01/07/22 1648  Resp 12 01/07/22 1648  SpO2 98 % 01/07/22 1648  Vitals shown include unvalidated device data.  Last Pain:  Vitals:   01/07/22 1102  TempSrc: Temporal  PainSc: 0-No pain         Complications: No notable events documented.

## 2022-01-08 ENCOUNTER — Encounter: Payer: Self-pay | Admitting: Orthopedic Surgery

## 2022-01-08 DIAGNOSIS — Z96651 Presence of right artificial knee joint: Secondary | ICD-10-CM | POA: Diagnosis not present

## 2022-01-08 DIAGNOSIS — I4819 Other persistent atrial fibrillation: Secondary | ICD-10-CM | POA: Diagnosis not present

## 2022-01-08 DIAGNOSIS — Z79899 Other long term (current) drug therapy: Secondary | ICD-10-CM | POA: Diagnosis not present

## 2022-01-08 DIAGNOSIS — M1712 Unilateral primary osteoarthritis, left knee: Secondary | ICD-10-CM | POA: Diagnosis not present

## 2022-01-08 DIAGNOSIS — I10 Essential (primary) hypertension: Secondary | ICD-10-CM | POA: Diagnosis not present

## 2022-01-08 DIAGNOSIS — Z7901 Long term (current) use of anticoagulants: Secondary | ICD-10-CM | POA: Diagnosis not present

## 2022-01-08 MED ORDER — CELECOXIB 200 MG PO CAPS: 200.0000 mg | ORAL_CAPSULE | Freq: Two times a day (BID) | ORAL | 0 refills | Status: AC

## 2022-01-08 MED ORDER — OXYCODONE HCL 5 MG PO TABS: 5.0000 mg | ORAL_TABLET | ORAL | 0 refills | Status: AC | PRN

## 2022-01-08 MED ORDER — TRAMADOL HCL 50 MG PO TABS: 50.0000 mg | ORAL_TABLET | ORAL | 0 refills | Status: AC | PRN

## 2022-01-08 NOTE — Discharge Summary (Signed)
Physician Discharge Summary  Patient ID: Meredith Reyes MRN: [id number] DOB/AGE: [birth year] 66 y.o.  Admit date: 01/07/2022 Discharge date: 01/08/2022  Admission Diagnoses:  Total knee replacement status [Z96.659]  Surgeries:Procedure(s):  Left total knee arthroplasty using computer-assisted navigation   SURGEON:  Jena Gauss. M.D.   ASSISTANT: Baldwin Jamaica, PA-C (present and scrubbed throughout the case, critical for assistance with exposure, retraction, instrumentation, and closure)   ANESTHESIA: spinal   ESTIMATED BLOOD LOSS: 100 mL   FLUIDS REPLACED: 1300 mL of crystalloid   TOURNIQUET TIME: 124 minutes   DRAINS: 2 medium Hemovac drains   SOFT TISSUE RELEASES: Anterior cruciate ligament, posterior cruciate ligament, deep medial collateral ligament, patellofemoral ligament   IMPLANTS UTILIZED: DePuy Attune size 6N posterior stabilized femoral component (cemented), size 5 rotating platform tibial component (cemented), 38 mm medialized dome patella (cemented), and a 5 mm stabilized rotating platform polyethylene insert.  Discharge Diagnoses: Patient Active Problem List   Diagnosis Date Noted   Total knee replacement status 10/03/2021   Persistent atrial fibrillation (HCC) 09/03/2021   Prediabetes 12/09/2018   HTN, goal below 140/90 11/27/2016   Primary osteoarthritis of both knees 11/23/2014   Pure hypercholesterolemia 11/23/2014    Past Medical History:  Diagnosis Date   Arthritis    Atrial fibrillation (HCC) 08/29/2021   a.) new onset; discovered on preop ECG 08/29/2021. b.) CHA2DS2-VASc = 3 (age, sex, HTN). c.) rate/rhythm maintained with oral carvedilol; on chronic anticoagulation using apixaban (started 10/2021).   GERD (gastroesophageal reflux disease)    HLD (hyperlipidemia)    Hypertension    Long term current use of anticoagulant    a.) apixaban   Prediabetes      Transfusion:    Consultants (if any):   Discharged Condition: Improved  Hospital  Course: Meredith Reyes is an 66 y.o. female who was admitted 01/07/2022 with a diagnosis of left knee osteoarthritis and went to the operating room on 01/07/2022 and underwent left total knee arthoplasty. The patient received perioperative antibiotics for prophylaxis (see below). The patient tolerated the procedure well and was transported to PACU in stable condition. After meeting PACU criteria, the patient was subsequently transferred to the Orthopaedics/Rehabilitation unit.   The patient received DVT prophylaxis in the form of early mobilization, TED hose and Eliquis, and SCDs . A sacral pad had been placed and heels were elevated off of the bed with rolled towels in order to protect skin integrity. Foley catheter was discontinued on postoperative day #0. Wound drains were discontinued on postoperative day #1. The surgical incision was healing well without signs of infection.  Physical therapy was initiated postoperatively for transfers, gait training, and strengthening. Occupational therapy was initiated for activities of daily living and evaluation for assisted devices. Rehabilitation goals were reviewed in detail with the patient. The patient made steady progress with physical therapy and physical therapy recommended discharge to Home.   The patient achieved the preliminary goals of this hospitalization and was felt to be medically and orthopaedically appropriate for discharge.  She was given perioperative antibiotics:  Anti-infectives (From admission, onward)    Start     Dose/Rate Route Frequency Ordered Stop   01/07/22 1900  ceFAZolin (ANCEF) IVPB 2g/100 mL premix        2 g 200 mL/hr over 30 Minutes Intravenous Every 6 hours 01/07/22 1742 01/08/22 0130   01/07/22 1053  ceFAZolin (ANCEF) 2-4 GM/100ML-% IVPB       Note to Pharmacy: Sander Radon: cabinet override  01/07/22 1053 01/07/22 1328   01/07/22 0600  ceFAZolin (ANCEF) IVPB 2g/100 mL premix        2 g 200 mL/hr over 30 Minutes  Intravenous On call to O.R. 01/06/22 2156 01/07/22 1249     .  Recent vital signs:  Vitals:   01/08/22 0555 01/08/22 0834  BP: (!) 122/55 131/64  Pulse: 70 74  Resp: 17 16  Temp: 97.6 F (36.4 C) 97.8 F (36.6 C)  SpO2: 95% 97%    Recent laboratory studies:  No results for input(s): "WBC", "HGB", "HCT", "PLT", "K", "CL", "CO2", "BUN", "CREATININE", "GLUCOSE", "CALCIUM", "LABPT", "INR" in the last 72 hours.  Diagnostic Studies: DG Knee Left Port  Result Date: 01/07/2022 CLINICAL DATA:  Status post left total knee replacement.  [id number] EXAM: PORTABLE LEFT KNEE - 1-2 VIEW COMPARISON:  None available. FINDINGS: Status post total left knee arthroplasty. No evidence of fracture, dislocation, or joint effusion. No evidence of arthropathy or other focal bone abnormality. Subcutaneus soft tissue edema and emphysema consistent with postsurgical changes. Overlying skin staples. Couple of overlying surgical drain distal to the thigh. IMPRESSION: Status post total left knee arthroplasty. Electronically Signed   By: Tish FredericksonMorgane  Naveau M.D.   On: 01/07/2022 17:34    Discharge Medications:   Allergies as of 01/08/2022       Reactions   Pravastatin Other (See Comments)   Muscle pain.        Medication List     TAKE these medications    acetaminophen 10 MG/ML Soln Commonly known as: OFIRMEV Inject 100 mLs (1,000 mg total) into the vein every 6 (six) hours.   apixaban 5 MG Tabs tablet Commonly known as: ELIQUIS Take 5 mg by mouth 2 (two) times daily.   carvedilol 12.5 MG tablet Commonly known as: COREG Take 12.5 mg by mouth 2 (two) times daily with a meal.   celecoxib 200 MG capsule Commonly known as: CELEBREX Take 1 capsule (200 mg total) by mouth 2 (two) times daily. What changed: Another medication with the same name was added. Make sure you understand how and when to take each.   celecoxib 200 MG capsule Commonly known as: CELEBREX Take 1 capsule (200 mg total) by mouth 2 (two)  times daily. What changed: You were already taking a medication with the same name, and this prescription was added. Make sure you understand how and when to take each.   metoprolol succinate 25 MG 24 hr tablet Commonly known as: TOPROL-XL Take 1 tablet by mouth daily.   omeprazole 20 MG capsule Commonly known as: PRILOSEC Take 20 mg by mouth every morning.   oxyCODONE 5 MG immediate release tablet Commonly known as: Oxy IR/ROXICODONE Take 1 tablet (5 mg total) by mouth every 4 (four) hours as needed for severe pain.   senna-docusate 8.6-50 MG tablet Commonly known as: Senokot-S Take 1 tablet by mouth 2 (two) times daily.   traMADol 50 MG tablet Commonly known as: ULTRAM Take 1 tablet (50 mg total) by mouth every 4 (four) hours as needed for moderate pain.               Durable Medical Equipment  (From admission, onward)           Start     Ordered   01/07/22 1743  DME Walker rolling  Once       Question:  Patient needs a walker to treat with the following condition  Answer:  Total knee replacement status  01/07/22 1742   01/07/22 1743  DME Bedside commode  Once       Question:  Patient needs a bedside commode to treat with the following condition  Answer:  Total knee replacement status   01/07/22 1742            Disposition: Home with home health PT     Follow-up Information     Madelyn Flavors, PA-C Follow up on 01/22/2022.   Specialty: Orthopedic Surgery Why: at 1:15pm Contact information: [address] [facility] [place] [address] [phone]         Donato Heinz, MD Follow up on 02/24/2022.   Specialty: Orthopedic Surgery Why: at 2:45pm Contact information: [address] [facility] [address] [facility] [address] [facility] [place] [address] [phone]                  Lasandra Beech, PA-C 01/08/2022, 12:02 PM

## 2022-01-08 NOTE — Progress Notes (Signed)
Post-op dressing removed. , Hemovac removed., Mini compression dressing applied. , and Fresh honeycomb dressing applied.  Bleeding noted from distal third of incision, area reinforced with 4x4 gauze under honeycomb

## 2022-01-08 NOTE — Progress Notes (Signed)
OT Cancellation Note  Patient Details Name: Meredith Reyes MRN: [id number] DOB: 1956/05/15   Cancelled Treatment:    Reason Eval/Treat Not Completed: OT screened, no needs identified, will sign off. Consult received, chart reviewed. Met briefly with pt and family. Pt/family have plan for polar care compression stockings, ADL assist as needed at discharge. Pt denies concerns and denies additional OT needs. OT handout provided as review. Will sign off.   Ardeth Perfect., MPH, MS, OTR/L ascom [phone] 01/08/22, 1:44 PM

## 2022-01-08 NOTE — Progress Notes (Signed)
Physical Therapy Treatment Patient Details Name: Meredith Reyes MRN: [id number] DOB: November 10, 1955 Today's Date: 01/08/2022   History of Present Illness Pt is a 66 y.o. female s/p L TKA secondary to degenerative arthrosis of L knee.  PMH includes htn, a-fib, R TKA 10/03/21.    PT Comments    Pt sitting up in recliner upon PT arrival; pt's family present.  During session pt modified independent with bed mobility; SBA with transfers; CGA progressing to SBA ambulating 240 feet total with RW; and CGA navigating 4 steps with B railings.  Minimal L knee pain with activity and 0/10 L knee pain at rest.  Pt reported no questions with LE exercise program.  Reviewed safe car transfers and home safety: pt verbalizing appropriate understanding.  Pt appears safe to discharge home from PT perspective: PA and nurse notified.    Recommendations for follow up therapy are one component of a multi-disciplinary discharge planning process, led by the attending physician.  Recommendations may be updated based on patient status, additional functional criteria and insurance authorization.  Follow Up Recommendations  Home health PT     Assistance Recommended at Discharge Set up Supervision/Assistance  Patient can return home with the following A little help with bathing/dressing/bathroom;Assistance with cooking/housework;Assist for transportation;Help with stairs or ramp for entrance;A little help with walking and/or transfers   Equipment Recommendations  None recommended by PT (pt has needed DME at home (RW and [facility]))    Recommendations for Other Services OT consult     Precautions / Restrictions Precautions Precautions: Fall;Knee Precaution Booklet Issued: Yes (comment) Required Braces or Orthoses: Knee Immobilizer - Left Knee Immobilizer - Left: Discontinue once straight leg raise with < 10 degree lag Restrictions Weight Bearing Restrictions: Yes LLE Weight Bearing: Weight bearing as tolerated      Mobility  Bed Mobility Overal bed mobility: Modified Independent             General bed mobility comments: Bed flat; mild increased effort to perform on own (supine to/from sitting)    Transfers Overall transfer level: Needs assistance Equipment used: Rolling walker (2 wheels) Transfers: Sit to/from Stand Sit to Stand: Supervision           General transfer comment: use of momentum to stand from recliner and from bed x1 trial each    Ambulation/Gait Ambulation/Gait assistance: Min guard, Supervision Gait Distance (Feet): 240 Feet (pt performed stairs training after 120 feet and then continued walking after (for another 120 feet)) Assistive device: Rolling walker (2 wheels)   Gait velocity: mildly decreased     General Gait Details: mildly antalgic/decreased stance time L LE; partial step through gait pattern; steady with RW use   Stairs Stairs: Yes Stairs assistance: Min guard Stair Management: Two rails, Step to pattern, Forwards Number of Stairs: 4 General stair comments: steady safe stairs navigation after initial cueing for technique/LE sequencing   Wheelchair Mobility    Modified Rankin (Stroke Patients Only)       Balance Overall balance assessment: Needs assistance Sitting-balance support: No upper extremity supported, Feet supported Sitting balance-Leahy Scale: Normal Sitting balance - Comments: steady sitting reaching outside BOS   Standing balance support: Bilateral upper extremity supported, During functional activity, Reliant on assistive device for balance Standing balance-Leahy Scale: Good Standing balance comment: steady ambulating with RW use                            Cognition Arousal/Alertness: Awake/alert Behavior  During Therapy: WFL for tasks assessed/performed Overall Cognitive Status: Within Functional Limits for tasks assessed                                          Exercises Total Joint  Exercises Long Arc Quad: AROM, Strengthening, Left, 10 reps, Seated Knee Flexion: AROM, Strengthening, Left, 10 reps, Seated Goniometric ROM: L knee AROM 10-90 degrees (from AM session)    General Comments General comments (skin integrity, edema, etc.): mild drainage on honeycomb just above reinforced dressing on knee; mild increased drainage noted L lateral distal thigh (from hemovac).      Pertinent Vitals/Pain Pain Assessment Pain Assessment: 0-10 Pain Score: 0-No pain (minimal discomfort with mobility) Pain Location: L knee Pain Descriptors / Indicators: Discomfort Pain Intervention(s): Limited activity within patient's tolerance, Monitored during session, Repositioned, Other (comment) (polar care applied) Vitals (HR and O2 on room air) stable and WFL throughout treatment session.    Home Living                          Prior Function            PT Goals (current goals can now be found in the care plan section) Acute Rehab PT Goals Patient Stated Goal: to go home and improve mobility PT Goal Formulation: With patient Time For Goal Achievement: 01/08/22 Potential to Achieve Goals: Good Progress towards PT goals: Progressing toward goals    Frequency    BID      PT Plan Current plan remains appropriate    Co-evaluation              AM-PAC PT "6 Clicks" Mobility   Outcome Measure  Help needed turning from your back to your side while in a flat bed without using bedrails?: None Help needed moving from lying on your back to sitting on the side of a flat bed without using bedrails?: None Help needed moving to and from a bed to a chair (including a wheelchair)?: A Little Help needed standing up from a chair using your arms (e.g., wheelchair or bedside chair)?: A Little Help needed to walk in hospital room?: A Little Help needed climbing 3-5 steps with a railing? : A Little 6 Click Score: 20    End of Session Equipment Utilized During Treatment:  Gait belt Activity Tolerance: Patient tolerated treatment well Patient left: in chair;with call bell/phone within reach;with SCD's reapplied;Other (comment);with family/visitor present (L heel floating via towel roll) Nurse Communication: Mobility status;Precautions;Weight bearing status PT Visit Diagnosis: Other abnormalities of gait and mobility (R26.89);Muscle weakness (generalized) (M62.81);Pain Pain - Right/Left: Left Pain - part of body: Knee     Time: 2229-7989 PT Time Calculation (min) (ACUTE ONLY): 19 min  Charges:  $Gait Training: 8-22 mins                    Hendricks Limes, PT 01/08/22, 1:58 PM

## 2022-01-08 NOTE — Anesthesia Postprocedure Evaluation (Signed)
Anesthesia Post Note  Patient: Meredith Reyes  Procedure(s) Performed: COMPUTER ASSISTED TOTAL KNEE ARTHROPLASTY (Left: Knee)  Patient location during evaluation: Nursing Unit Anesthesia Type: Spinal Level of consciousness: oriented and awake and alert Pain management: pain level controlled Vital Signs Assessment: post-procedure vital signs reviewed and stable Respiratory status: spontaneous breathing and respiratory function stable Cardiovascular status: blood pressure returned to baseline and stable Postop Assessment: no headache, no backache, no apparent nausea or vomiting and patient able to bend at knees Anesthetic complications: no   No notable events documented.   Last Vitals:  Vitals:   01/07/22 2056 01/08/22 0555  BP: 135/65 (!) 122/55  Pulse: 73 70  Resp: 17 17  Temp: 36.6 C 36.4 C  SpO2: 95% 95%    Last Pain:  Vitals:   01/07/22 2112  TempSrc:   PainSc: 0-No pain                 Starling Manns

## 2022-01-08 NOTE — Plan of Care (Signed)

## 2022-01-08 NOTE — Evaluation (Signed)
Physical Therapy Evaluation Patient Details Name: Meredith Reyes MRN: [id number] DOB: 1955/08/16 Today's Date: 01/08/2022  History of Present Illness  Pt is a 66 y.o. female s/p L TKA secondary to degenerative arthrosis of L knee.  PMH includes htn, a-fib, R TKA 10/03/21.  Clinical Impression  Prior to surgery, pt was modified independent ambulating with RW; lives with family in 1 level home with ramp to enter; no recent falls.  Currently pt is CGA with transfers and ambulating 200 feet with RW use.  0/10 pain at rest and mild discomfort L knee with activity.  Able to perform L LE SLR modified independently (so no KI utilized) and L knee AROM to 90 degrees.  Pt would benefit from skilled PT to address noted impairments and functional limitations (see below for any additional details).  Upon hospital discharge, pt would benefit from HHPT and support from family.    Recommendations for follow up therapy are one component of a multi-disciplinary discharge planning process, led by the attending physician.  Recommendations may be updated based on patient status, additional functional criteria and insurance authorization.  Follow Up Recommendations Home health PT    Assistance Recommended at Discharge Set up Supervision/Assistance  Patient can return home with the following  A little help with bathing/dressing/bathroom;Assistance with cooking/housework;Assist for transportation;Help with stairs or ramp for entrance;A little help with walking and/or transfers    Equipment Recommendations None recommended by PT (pt has needed DME at home already (RW and [facility]  Recommendations for Other Services  OT consult    Functional Status Assessment Patient has had a recent decline in their functional status and demonstrates the ability to make significant improvements in function in a reasonable and predictable amount of time.     Precautions / Restrictions Precautions Precautions: Fall;Knee Precaution  Booklet Issued: Yes (comment) Required Braces or Orthoses: Knee Immobilizer - Left Knee Immobilizer - Left: Discontinue once straight leg raise with < 10 degree lag Restrictions Weight Bearing Restrictions: Yes LLE Weight Bearing: Weight bearing as tolerated      Mobility  Bed Mobility               General bed mobility comments: Deferred (pt in recliner beginning/end of session)    Transfers Overall transfer level: Needs assistance Equipment used: Rolling walker (2 wheels) Transfers: Sit to/from Stand Sit to Stand: Min guard           General transfer comment: pt using momentum and B armrests to stand up to [employer]    Ambulation/Gait Ambulation/Gait assistance: Min guard Gait Distance (Feet): 200 Feet Assistive device: Rolling walker (2 wheels)   Gait velocity: mildly decreased     General Gait Details: mildly antalgic/decreased stance time L LE; partial step through gait pattern; steady with RW use  Stairs            Wheelchair Mobility    Modified Rankin (Stroke Patients Only)       Balance Overall balance assessment: Needs assistance Sitting-balance support: No upper extremity supported, Feet supported Sitting balance-Leahy Scale: Normal Sitting balance - Comments: steady sitting reaching outside BOS   Standing balance support: Bilateral upper extremity supported, During functional activity, Reliant on assistive device for balance Standing balance-Leahy Scale: Good Standing balance comment: steady ambulating with RW use                             Pertinent Vitals/Pain Pain Assessment Pain Assessment: 0-10 Pain Score:  0-No pain (mild discomfort with walking) Pain Location: L knee Pain Descriptors / Indicators: Discomfort Pain Intervention(s): Limited activity within patient's tolerance, Monitored during session, Premedicated before session, Repositioned, Other (comment) (polar care applied) Vitals (HR and O2 on room air) stable  and WFL throughout treatment session.    Home Living Family/patient expects to be discharged to:: Private residence Living Arrangements: Children;Other relatives Available Help at Discharge: Family;Available 24 hours/day (pt's daughter and grandson) Type of Home: House Home Access: Ramped entrance       Home Layout: One level Home Equipment: Agricultural consultant (2 wheels);BSC/3in1;Shower seat      Prior Function Prior Level of Function : Independent/Modified Independent             Mobility Comments: Ambulatory with RW; no recent falls       Hand Dominance        Extremity/Trunk Assessment   Upper Extremity Assessment Upper Extremity Assessment: Overall WFL for tasks assessed    Lower Extremity Assessment Lower Extremity Assessment: LLE deficits/detail (R LE WFL) LLE Deficits / Details: able to perform L LE SLR independently; at least 3/5 AROM hip flexion and ankle DF/PF LLE: Unable to fully assess due to pain    Cervical / Trunk Assessment Cervical / Trunk Assessment: Other exceptions Cervical / Trunk Exceptions: forward head/shoulders  Communication   Communication: No difficulties  Cognition Arousal/Alertness: Awake/alert Behavior During Therapy: WFL for tasks assessed/performed Overall Cognitive Status: Within Functional Limits for tasks assessed                                          General Comments General comments (skin integrity, edema, etc.): L LE hemovac in place.  Nursing cleared pt for participation in physical therapy.  Pt agreeable to PT session.    Exercises Total Joint Exercises Ankle Circles/Pumps: AROM, Strengthening, Both, 10 reps, Supine Quad Sets: AROM, Strengthening, Both, 10 reps, Supine Short Arc Quad: AROM, Strengthening, Left, 10 reps, Supine Heel Slides: AAROM, Strengthening, Left, 10 reps, Supine Hip ABduction/ADduction: AROM, Strengthening, Left, 10 reps, Supine Straight Leg Raises: AROM, Strengthening, Left,  10 reps, Supine Goniometric ROM: L knee AROM 10-90 degrees   Assessment/Plan    PT Assessment Patient needs continued PT services  PT Problem List Decreased strength;Decreased range of motion;Decreased activity tolerance;Decreased balance;Decreased mobility;Decreased knowledge of precautions;Decreased knowledge of use of DME;Pain;Decreased skin integrity       PT Treatment Interventions DME instruction;Gait training;Functional mobility training;Therapeutic activities;Therapeutic exercise;Balance training;Stair training;Patient/family education    PT Goals (Current goals can be found in the Care Plan section)  Acute Rehab PT Goals Patient Stated Goal: to go home and improve mobility PT Goal Formulation: With patient Time For Goal Achievement: 01/08/22 Potential to Achieve Goals: Good    Frequency BID     Co-evaluation               AM-PAC PT "6 Clicks" Mobility  Outcome Measure Help needed turning from your back to your side while in a flat bed without using bedrails?: None Help needed moving from lying on your back to sitting on the side of a flat bed without using bedrails?: A Little Help needed moving to and from a bed to a chair (including a wheelchair)?: A Little Help needed standing up from a chair using your arms (e.g., wheelchair or bedside chair)?: A Little Help needed to walk in hospital room?: A Little  Help needed climbing 3-5 steps with a railing? : A Little 6 Click Score: 19    End of Session Equipment Utilized During Treatment: Gait belt Activity Tolerance: Patient tolerated treatment well Patient left: in chair;with call bell/phone within reach;with SCD's reapplied;Other (comment) (L heel floating via towel roll; pt verbalizing need to call staff and wait for staff assist prior to getting up out of chair) Nurse Communication: Mobility status;Precautions;Weight bearing status PT Visit Diagnosis: Other abnormalities of gait and mobility (R26.89);Muscle  weakness (generalized) (M62.81);Pain Pain - Right/Left: Left Pain - part of body: Knee    Time: 9562-1308 PT Time Calculation (min) (ACUTE ONLY): 20 min   Charges:   PT Evaluation $PT Eval Low Complexity: 1 Low PT Treatments $Therapeutic Exercise: 8-22 mins       Hendricks Limes, PT 01/08/22, 9:44 AM

## 2022-01-08 NOTE — Progress Notes (Signed)
Met with the patient in the room at the bedside The patient lives at Home with her daughter The patient  currently has DME Rolling walker and 3 in 1 The patient will need no additional DME They have transportation with daughter They can afford their medication  They are set up with [facility]  for Home health services   Admitted for: total knee replacement

## 2022-01-08 NOTE — Progress Notes (Signed)
  Subjective: 1 Day Post-Op Procedure(s) (LRB): COMPUTER ASSISTED TOTAL KNEE ARTHROPLASTY (Left) Patient reports pain as well-controlled.   Patient is well, and has had no acute complaints or problems Plan is to go Home after hospital stay. Negative for chest pain and shortness of breath Fever: no Gastrointestinal: negative for nausea and vomiting.  Patient has had a bowel movement.  Objective: Vital signs in last 24 hours: Temp:  [96.8 F (36 C)-98.2 F (36.8 C)] 97.6 F (36.4 C) (06/08 0555) Pulse Rate:  [40-127] 70 (06/08 0555) Resp:  [11-19] 17 (06/08 0555) BP: (114-157)/(55-98) 122/55 (06/08 0555) SpO2:  [95 %-99 %] 95 % (06/08 0555) Weight:  [119.7 kg] 119.7 kg (06/07 1102)  Intake/Output from previous day:  Intake/Output Summary (Last 24 hours) at 01/08/2022 0827 Last data filed at 01/07/2022 2112 Gross per 24 hour  Intake 2022.72 ml  Output 830 ml  Net 1192.72 ml    Intake/Output this shift: No intake/output data recorded.  Labs: No results for input(s): "HGB" in the last 72 hours. No results for input(s): "WBC", "RBC", "HCT", "PLT" in the last 72 hours. No results for input(s): "NA", "K", "CL", "CO2", "BUN", "CREATININE", "GLUCOSE", "CALCIUM" in the last 72 hours. No results for input(s): "LABPT", "INR" in the last 72 hours.   EXAM General - Patient is Alert, Appropriate, and Oriented Extremity - Neurovascular intact Dorsiflexion/Plantar flexion intact Compartment soft Dressing/Incision -Postoperative dressing remains in place., Polar Care in place and working. , Hemovac in place.  Motor Function - intact, moving foot and toes well on exam. Able to perform independent SLR.  Cardiovascular-  irregular rate and rhythm Respiratory- Lungs clear to auscultation bilaterally Gastrointestinal- soft, nontender, and active bowel sounds   Assessment/Plan: 1 Day Post-Op Procedure(s) (LRB): COMPUTER ASSISTED TOTAL KNEE ARTHROPLASTY (Left) Principal Problem:   Total  knee replacement status  Estimated body mass index is 41.35 kg/m as calculated from the following:   Height as of this encounter: 5\' 7"  (1.702 m).   Weight as of this encounter: 119.7 kg. Advance diet Up with therapy  Anticipate d/c this PM pending completion of therapy goals.      DVT Prophylaxis - Eliquis, Ted hose, and SCDs Weight-Bearing as tolerated to left leg  , PA-C [facility] 01/08/2022, 8:27 AM

## 2022-01-22 DIAGNOSIS — M25462 Effusion, left knee: Secondary | ICD-10-CM | POA: Diagnosis not present

## 2022-01-22 DIAGNOSIS — M25562 Pain in left knee: Secondary | ICD-10-CM | POA: Diagnosis not present

## 2022-01-28 DIAGNOSIS — Z96652 Presence of left artificial knee joint: Secondary | ICD-10-CM | POA: Diagnosis not present

## 2022-01-30 DIAGNOSIS — Z96652 Presence of left artificial knee joint: Secondary | ICD-10-CM | POA: Diagnosis not present

## 2022-02-04 DIAGNOSIS — Z471 Aftercare following joint replacement surgery: Secondary | ICD-10-CM | POA: Diagnosis not present

## 2022-02-04 DIAGNOSIS — Z96652 Presence of left artificial knee joint: Secondary | ICD-10-CM | POA: Diagnosis not present

## 2022-02-06 DIAGNOSIS — Z96652 Presence of left artificial knee joint: Secondary | ICD-10-CM | POA: Diagnosis not present

## 2022-02-11 DIAGNOSIS — M25662 Stiffness of left knee, not elsewhere classified: Secondary | ICD-10-CM | POA: Diagnosis not present

## 2022-02-11 DIAGNOSIS — Z6841 Body Mass Index (BMI) 40.0 and over, adult: Secondary | ICD-10-CM | POA: Diagnosis not present

## 2022-02-11 DIAGNOSIS — I4819 Other persistent atrial fibrillation: Secondary | ICD-10-CM | POA: Diagnosis not present

## 2022-02-11 DIAGNOSIS — E7800 Pure hypercholesterolemia, unspecified: Secondary | ICD-10-CM | POA: Diagnosis not present

## 2022-02-11 DIAGNOSIS — I10 Essential (primary) hypertension: Secondary | ICD-10-CM | POA: Diagnosis not present

## 2022-02-13 DIAGNOSIS — M25662 Stiffness of left knee, not elsewhere classified: Secondary | ICD-10-CM | POA: Diagnosis not present

## 2022-02-18 DIAGNOSIS — M25662 Stiffness of left knee, not elsewhere classified: Secondary | ICD-10-CM | POA: Diagnosis not present

## 2022-02-20 DIAGNOSIS — M25662 Stiffness of left knee, not elsewhere classified: Secondary | ICD-10-CM | POA: Diagnosis not present

## 2022-02-24 DIAGNOSIS — M25662 Stiffness of left knee, not elsewhere classified: Secondary | ICD-10-CM | POA: Diagnosis not present

## 2022-02-24 DIAGNOSIS — Z96652 Presence of left artificial knee joint: Secondary | ICD-10-CM | POA: Diagnosis not present

## 2022-02-27 DIAGNOSIS — M25662 Stiffness of left knee, not elsewhere classified: Secondary | ICD-10-CM | POA: Diagnosis not present

## 2022-03-10 DIAGNOSIS — I4819 Other persistent atrial fibrillation: Secondary | ICD-10-CM | POA: Diagnosis not present

## 2022-03-10 DIAGNOSIS — Z0000 Encounter for general adult medical examination without abnormal findings: Secondary | ICD-10-CM | POA: Diagnosis not present

## 2022-03-10 DIAGNOSIS — E7800 Pure hypercholesterolemia, unspecified: Secondary | ICD-10-CM | POA: Diagnosis not present

## 2022-03-10 DIAGNOSIS — I10 Essential (primary) hypertension: Secondary | ICD-10-CM | POA: Diagnosis not present

## 2022-03-10 DIAGNOSIS — R7303 Prediabetes: Secondary | ICD-10-CM | POA: Diagnosis not present

## 2022-03-11 ENCOUNTER — Other Ambulatory Visit: Payer: Self-pay | Admitting: Internal Medicine

## 2022-03-11 DIAGNOSIS — Z1231 Encounter for screening mammogram for malignant neoplasm of breast: Secondary | ICD-10-CM

## 2022-04-07 ENCOUNTER — Ambulatory Visit
Admission: RE | Admit: 2022-04-07 | Discharge: 2022-04-07 | Disposition: A | Discharge status: Home / Self Care | Payer: Medicare HMO | Source: Ambulatory Visit | Attending: Internal Medicine | Admitting: Internal Medicine

## 2022-04-07 DIAGNOSIS — Z1231 Encounter for screening mammogram for malignant neoplasm of breast: Secondary | ICD-10-CM | POA: Insufficient documentation

## 2022-04-14 ENCOUNTER — Other Ambulatory Visit: Payer: Self-pay | Admitting: Internal Medicine

## 2022-04-14 DIAGNOSIS — R928 Other abnormal and inconclusive findings on diagnostic imaging of breast: Secondary | ICD-10-CM

## 2022-04-14 DIAGNOSIS — R921 Mammographic calcification found on diagnostic imaging of breast: Secondary | ICD-10-CM

## 2022-05-01 ENCOUNTER — Other Ambulatory Visit: Payer: Medicare HMO

## 2022-05-18 ENCOUNTER — Ambulatory Visit
Admission: RE | Admit: 2022-05-18 | Discharge: 2022-05-18 | Disposition: A | Discharge status: Home / Self Care | Payer: Medicare HMO | Source: Ambulatory Visit | Attending: Internal Medicine | Admitting: Internal Medicine

## 2022-05-18 DIAGNOSIS — R92312 Mammographic fatty tissue density, left breast: Secondary | ICD-10-CM | POA: Diagnosis not present

## 2022-05-18 DIAGNOSIS — R921 Mammographic calcification found on diagnostic imaging of breast: Secondary | ICD-10-CM | POA: Diagnosis not present

## 2022-05-18 DIAGNOSIS — R928 Other abnormal and inconclusive findings on diagnostic imaging of breast: Secondary | ICD-10-CM

## 2022-06-18 DIAGNOSIS — I10 Essential (primary) hypertension: Secondary | ICD-10-CM | POA: Diagnosis not present

## 2022-06-18 DIAGNOSIS — I4819 Other persistent atrial fibrillation: Secondary | ICD-10-CM | POA: Diagnosis not present

## 2022-06-18 DIAGNOSIS — Z23 Encounter for immunization: Secondary | ICD-10-CM | POA: Diagnosis not present

## 2022-07-16 DIAGNOSIS — Z96652 Presence of left artificial knee joint: Secondary | ICD-10-CM | POA: Diagnosis not present

## 2022-09-18 DIAGNOSIS — I10 Essential (primary) hypertension: Secondary | ICD-10-CM | POA: Diagnosis not present

## 2022-09-18 DIAGNOSIS — Z0000 Encounter for general adult medical examination without abnormal findings: Secondary | ICD-10-CM | POA: Diagnosis not present

## 2022-09-18 DIAGNOSIS — Z1331 Encounter for screening for depression: Secondary | ICD-10-CM | POA: Diagnosis not present

## 2022-09-18 DIAGNOSIS — E7800 Pure hypercholesterolemia, unspecified: Secondary | ICD-10-CM | POA: Diagnosis not present

## 2022-09-18 DIAGNOSIS — I4819 Other persistent atrial fibrillation: Secondary | ICD-10-CM | POA: Diagnosis not present

## 2022-09-18 DIAGNOSIS — R7303 Prediabetes: Secondary | ICD-10-CM | POA: Diagnosis not present

## 2022-12-17 DIAGNOSIS — E7800 Pure hypercholesterolemia, unspecified: Secondary | ICD-10-CM | POA: Diagnosis not present

## 2022-12-17 DIAGNOSIS — Z6841 Body Mass Index (BMI) 40.0 and over, adult: Secondary | ICD-10-CM | POA: Diagnosis not present

## 2022-12-17 DIAGNOSIS — I4819 Other persistent atrial fibrillation: Secondary | ICD-10-CM | POA: Diagnosis not present

## 2022-12-17 DIAGNOSIS — I10 Essential (primary) hypertension: Secondary | ICD-10-CM | POA: Diagnosis not present

## 2022-12-21 ENCOUNTER — Encounter: Payer: Self-pay | Admitting: Internal Medicine

## 2022-12-24 ENCOUNTER — Other Ambulatory Visit: Payer: Self-pay | Admitting: Internal Medicine

## 2022-12-24 DIAGNOSIS — R921 Mammographic calcification found on diagnostic imaging of breast: Secondary | ICD-10-CM

## 2023-01-15 ENCOUNTER — Ambulatory Visit
Admission: RE | Admit: 2023-01-15 | Discharge: 2023-01-15 | Disposition: A | Discharge status: Home / Self Care | Payer: Medicare HMO | Source: Ambulatory Visit | Attending: Internal Medicine | Admitting: Internal Medicine

## 2023-01-15 DIAGNOSIS — R921 Mammographic calcification found on diagnostic imaging of breast: Secondary | ICD-10-CM

## 2023-01-21 ENCOUNTER — Other Ambulatory Visit: Payer: Self-pay | Admitting: Internal Medicine

## 2023-01-21 DIAGNOSIS — R921 Mammographic calcification found on diagnostic imaging of breast: Secondary | ICD-10-CM

## 2023-01-21 DIAGNOSIS — R928 Other abnormal and inconclusive findings on diagnostic imaging of breast: Secondary | ICD-10-CM

## 2023-01-21 DIAGNOSIS — Z96653 Presence of artificial knee joint, bilateral: Secondary | ICD-10-CM | POA: Diagnosis not present

## 2023-03-22 DIAGNOSIS — E7800 Pure hypercholesterolemia, unspecified: Secondary | ICD-10-CM | POA: Diagnosis not present

## 2023-03-22 DIAGNOSIS — R7303 Prediabetes: Secondary | ICD-10-CM | POA: Diagnosis not present

## 2023-03-22 DIAGNOSIS — I4819 Other persistent atrial fibrillation: Secondary | ICD-10-CM | POA: Diagnosis not present

## 2023-03-22 DIAGNOSIS — I10 Essential (primary) hypertension: Secondary | ICD-10-CM | POA: Diagnosis not present

## 2023-03-22 DIAGNOSIS — Z1211 Encounter for screening for malignant neoplasm of colon: Secondary | ICD-10-CM | POA: Diagnosis not present

## 2023-03-28 DIAGNOSIS — Z1211 Encounter for screening for malignant neoplasm of colon: Secondary | ICD-10-CM | POA: Diagnosis not present

## 2023-04-02 LAB — COLOGUARD: COLOGUARD: NEGATIVE

## 2023-04-02 LAB — EXTERNAL GENERIC LAB PROCEDURE: COLOGUARD: NEGATIVE

## 2023-06-24 DIAGNOSIS — I4819 Other persistent atrial fibrillation: Secondary | ICD-10-CM | POA: Diagnosis not present

## 2023-06-24 DIAGNOSIS — Z6841 Body Mass Index (BMI) 40.0 and over, adult: Secondary | ICD-10-CM | POA: Diagnosis not present

## 2023-06-24 DIAGNOSIS — I10 Essential (primary) hypertension: Secondary | ICD-10-CM | POA: Diagnosis not present

## 2023-06-24 DIAGNOSIS — E7800 Pure hypercholesterolemia, unspecified: Secondary | ICD-10-CM | POA: Diagnosis not present

## 2023-07-20 ENCOUNTER — Ambulatory Visit
Admission: RE | Admit: 2023-07-20 | Discharge: 2023-07-20 | Disposition: A | Discharge status: Home / Self Care | Payer: Medicare HMO | Source: Ambulatory Visit | Attending: Internal Medicine | Admitting: Internal Medicine

## 2023-07-20 DIAGNOSIS — R921 Mammographic calcification found on diagnostic imaging of breast: Secondary | ICD-10-CM | POA: Diagnosis not present

## 2023-07-20 DIAGNOSIS — R92323 Mammographic fibroglandular density, bilateral breasts: Secondary | ICD-10-CM | POA: Diagnosis not present

## 2023-07-20 DIAGNOSIS — R928 Other abnormal and inconclusive findings on diagnostic imaging of breast: Secondary | ICD-10-CM | POA: Insufficient documentation

## 2023-09-24 DIAGNOSIS — R7303 Prediabetes: Secondary | ICD-10-CM | POA: Diagnosis not present

## 2023-09-24 DIAGNOSIS — I4819 Other persistent atrial fibrillation: Secondary | ICD-10-CM | POA: Diagnosis not present

## 2023-09-24 DIAGNOSIS — I10 Essential (primary) hypertension: Secondary | ICD-10-CM | POA: Diagnosis not present

## 2023-09-24 DIAGNOSIS — E7800 Pure hypercholesterolemia, unspecified: Secondary | ICD-10-CM | POA: Diagnosis not present

## 2023-09-24 DIAGNOSIS — Z0000 Encounter for general adult medical examination without abnormal findings: Secondary | ICD-10-CM | POA: Diagnosis not present

## 2023-10-24 ENCOUNTER — Ambulatory Visit
Admission: EM | Admit: 2023-10-24 | Discharge: 2023-10-24 | Disposition: A | Discharge status: Home / Self Care | Attending: Emergency Medicine | Admitting: Emergency Medicine

## 2023-10-24 DIAGNOSIS — S51802A Unspecified open wound of left forearm, initial encounter: Secondary | ICD-10-CM

## 2023-10-24 DIAGNOSIS — Z203 Contact with and (suspected) exposure to rabies: Secondary | ICD-10-CM

## 2023-10-24 DIAGNOSIS — L089 Local infection of the skin and subcutaneous tissue, unspecified: Secondary | ICD-10-CM | POA: Diagnosis not present

## 2023-10-24 DIAGNOSIS — L03114 Cellulitis of left upper limb: Secondary | ICD-10-CM

## 2023-10-24 DIAGNOSIS — T148XXA Other injury of unspecified body region, initial encounter: Secondary | ICD-10-CM | POA: Diagnosis not present

## 2023-10-24 MED ORDER — DOXYCYCLINE HYCLATE 100 MG PO CAPS: 100.0000 mg | ORAL_CAPSULE | Freq: Two times a day (BID) | ORAL | 0 refills | Status: AC

## 2023-10-24 MED ORDER — TETANUS-DIPHTH-ACELL PERTUSSIS 5-2.5-18.5 LF-MCG/0.5 IM SUSY
0.5000 mL | PREFILLED_SYRINGE | Freq: Once | INTRAMUSCULAR | Status: AC
  Administered 2023-10-24: 0.5 mL via INTRAMUSCULAR

## 2023-10-24 NOTE — ED Triage Notes (Signed)
 Patient to Urgent Care with complaints of wound present to her left wrist. Reports her dog scratched her arm last Sunday.  Area started developing some purulent drainage on Friday. Denies any fevers.   Has been cleaning the wound with peroxide/ applying a band-aid.

## 2023-10-24 NOTE — ED Provider Notes (Signed)
 Renaldo Fiddler    CSN: [id number] Arrival date & time: 10/24/23  0806      History   Chief Complaint Chief Complaint  Patient presents with   Wound Check    HPI Meredith Reyes is a 68 y.o. female.  Patient presents with 1 week history of a wound on her left forearm that occurred when her dog accidentally scratched her.  The wound has developed redness and purulent drainage.  She has been cleaning it with hydrogen peroxide.  She denies fever or chills.  Last tetanus unknown.  The history is provided by the patient and medical records.    Past Medical History:  Diagnosis Date   Arthritis    Atrial fibrillation (HCC) 08/29/2021   a.) new onset; discovered on preop ECG 08/29/2021. b.) CHA2DS2-VASc = 3 (age, sex, HTN). c.) rate/rhythm maintained with oral carvedilol; on chronic anticoagulation using apixaban (started 10/2021).   GERD (gastroesophageal reflux disease)    HLD (hyperlipidemia)    Hypertension    Long term current use of anticoagulant    a.) apixaban   Prediabetes     Patient Active Problem List   Diagnosis Date Noted   Total knee replacement status 10/03/2021   Persistent atrial fibrillation (HCC) 09/03/2021   Prediabetes 12/09/2018   HTN, goal below 140/90 11/27/2016   Primary osteoarthritis of both knees 11/23/2014   Pure hypercholesterolemia 11/23/2014    Past Surgical History:  Procedure Laterality Date   ABDOMINAL HYSTERECTOMY     BACK SURGERY     as a baby   ESOPHAGOGASTRODUODENOSCOPY (EGD) WITH PROPOFOL N/A 02/19/2020   Procedure: ESOPHAGOGASTRODUODENOSCOPY (EGD) WITH PROPOFOL;  Surgeon: Regis Bill, MD;  Location: [facility] ENDOSCOPY;  Service: Endoscopy;  Laterality: N/A;   KNEE ARTHROPLASTY Right 10/03/2021   Procedure: COMPUTER ASSISTED TOTAL KNEE ARTHROPLASTY - RNFA;  Surgeon: Donato Heinz, MD;  Location: [facility] ORS;  Service: Orthopedics;  Laterality: Right;   KNEE ARTHROPLASTY Left 01/07/2022   Procedure: COMPUTER ASSISTED TOTAL  KNEE ARTHROPLASTY;  Surgeon: Donato Heinz, MD;  Location: [facility] ORS;  Service: Orthopedics;  Laterality: Left;   nodule on back      OB History   No obstetric history on file.      Home Medications    Prior to Admission medications   Medication Sig Start Date End Date Taking? Authorizing Provider  doxycycline (VIBRAMYCIN) 100 MG capsule Take 1 capsule (100 mg total) by mouth 2 (two) times daily for 7 days. 10/24/23 10/31/23 Yes Mickie Bail, NP  acetaminophen (OFIRMEV) 10 MG/ML SOLN Inject 100 mLs (1,000 mg total) into the vein every 6 (six) hours. 10/04/21   Evon Slack, PA-C  apixaban (ELIQUIS) 5 MG TABS tablet Take 5 mg by mouth 2 (two) times daily.    [provider]  carvedilol (COREG) 12.5 MG tablet Take 12.5 mg by mouth 2 (two) times daily with a meal.    [provider]  celecoxib (CELEBREX) 200 MG capsule Take 1 capsule (200 mg total) by mouth 2 (two) times daily. Patient not taking: Reported on 12/26/2021 10/04/21   Evon Slack, PA-C  celecoxib (CELEBREX) 200 MG capsule Take 1 capsule (200 mg total) by mouth 2 (two) times daily. Patient not taking: Reported on 10/24/2023 01/08/22   Lasandra Beech B, PA-C  metoprolol succinate (TOPROL-XL) 25 MG 24 hr tablet Take 1 tablet by mouth daily. Patient not taking: Reported on 01/07/2022 11/28/21   [provider]  omeprazole (PRILOSEC) 20 MG capsule  Take 20 mg by mouth every morning.    [provider]  oxyCODONE (OXY IR/ROXICODONE) 5 MG immediate release tablet Take 1 tablet (5 mg total) by mouth every 4 (four) hours as needed for severe pain. Patient not taking: Reported on 10/24/2023 01/08/22   Lasandra Beech B, PA-C  senna-docusate (SENOKOT-S) 8.6-50 MG tablet Take 1 tablet by mouth 2 (two) times daily. Patient not taking: Reported on 12/26/2021 10/04/21   Evon Slack, PA-C  traMADol (ULTRAM) 50 MG tablet Take 1 tablet (50 mg total) by mouth every 4 (four) hours as needed for moderate  pain. Patient not taking: Reported on 10/24/2023 01/08/22   Myrtis Ser    Family History Family History  Problem Relation Age of Onset   Cancer Mother 20       throat ca   Breast cancer Neg Hx     Social History Social History   Tobacco Use   Smoking status: Never   Smokeless tobacco: Never  Vaping Use   Vaping status: Never Used  Substance Use Topics   Alcohol use: Never   Drug use: Never     Allergies   Pravastatin   Review of Systems Review of Systems  Constitutional:  Negative for chills and fever.  Musculoskeletal:  Negative for arthralgias and joint swelling.  Skin:  Positive for color change and wound.  Neurological:  Negative for weakness and numbness.     Physical Exam Triage Vital Signs ED Triage Vitals  Encounter Vitals Group     BP 10/24/23 0819 126/80     Systolic BP Percentile --      Diastolic BP Percentile --      Pulse Rate 10/24/23 0819 72     Resp 10/24/23 0819 18     Temp 10/24/23 0819 97.7 F (36.5 C)     Temp src --      SpO2 10/24/23 0819 96 %     Weight --      Height --      Head Circumference --      Peak Flow --      Pain Score 10/24/23 0821 0     Pain Loc --      Pain Education --      Exclude from Growth Chart --    No data found.  Updated Vital Signs BP 126/80   Pulse 72   Temp 97.7 F (36.5 C)   Resp 18   SpO2 96%   Visual Acuity Right Eye Distance:   Left Eye Distance:   Bilateral Distance:    Right Eye Near:   Left Eye Near:    Bilateral Near:     Physical Exam Constitutional:      General: She is not in acute distress. HENT:     Mouth/Throat:     Mouth: Mucous membranes are moist.  Cardiovascular:     Rate and Rhythm: Normal rate and regular rhythm.  Pulmonary:     Effort: Pulmonary effort is normal. No respiratory distress.  Musculoskeletal:        General: No swelling or deformity. Normal range of motion.  Skin:    General: Skin is warm and dry.     Capillary Refill: Capillary  refill takes less than 2 seconds.     Findings: Erythema and lesion present.     Comments: Wound on left forearm with localized erythema.  See picture for details.  Neurological:     Mental Status: She is alert.  Sensory: No sensory deficit.     Motor: No weakness.      UC Treatments / Results  Labs (all labs ordered are listed, but only abnormal results are displayed) Labs Reviewed - No data to display  EKG   Radiology No results found.  Procedures Procedures (including critical care time)  Medications Ordered in UC Medications  Tdap (BOOSTRIX) injection 0.5 mL (0.5 mLs Intramuscular Given 10/24/23 0835)    Initial Impression / Assessment and Plan / UC Course  I have reviewed the triage vital signs and the nursing notes.  Pertinent labs & imaging results that were available during my care of the patient were reviewed by me and considered in my medical decision making (see chart for details).    Infected wound with localized cellulitis of left forearm.  Tetanus updated.  Treating with doxycycline.  Wound care instructions and signs of worsening infection discussed.  Instructed patient to follow-up with her PCP tomorrow.  ED precautions given.  Education provided on cellulitis and wound infection.  Patient agrees to plan of care.  Final Clinical Impressions(s) / UC Diagnoses   Final diagnoses:  Infected wound  Cellulitis of forearm, left     Discharge Instructions      Your tetanus was updated today.    Take the antibiotic as directed.   Keep your wound clean and dry.  Wash it gently twice a day with soap and water.  Apply an antibiotic cream twice a day.    Follow-up with your primary care provider on Monday.  Go to the emergency department if you have worsening symptoms.      ED Prescriptions     Medication Sig Dispense Auth. Provider   doxycycline (VIBRAMYCIN) 100 MG capsule Take 1 capsule (100 mg total) by mouth 2 (two) times daily for 7 days. 14  capsule Mickie Bail, NP      PDMP not reviewed this encounter.   Mickie Bail, NP 10/24/23 [phone]

## 2023-10-24 NOTE — Discharge Instructions (Addendum)
 Your tetanus was updated today.    Take the antibiotic as directed.   Keep your wound clean and dry.  Wash it gently twice a day with soap and water.  Apply an antibiotic cream twice a day.    Follow-up with your primary care provider on Monday.  Go to the emergency department if you have worsening symptoms.

## 2023-10-27 DIAGNOSIS — S41112A Laceration without foreign body of left upper arm, initial encounter: Secondary | ICD-10-CM | POA: Diagnosis not present

## 2024-01-03 IMAGING — DX DG KNEE 1-2V PORT*L*
2 series · 2 of 2 positions shown · non-contrast
Comparison: None available.

CLINICAL DATA: Status post left total knee replacement.  [id number]

EXAM:
PORTABLE LEFT KNEE - 1-2 VIEW

[knee ap]
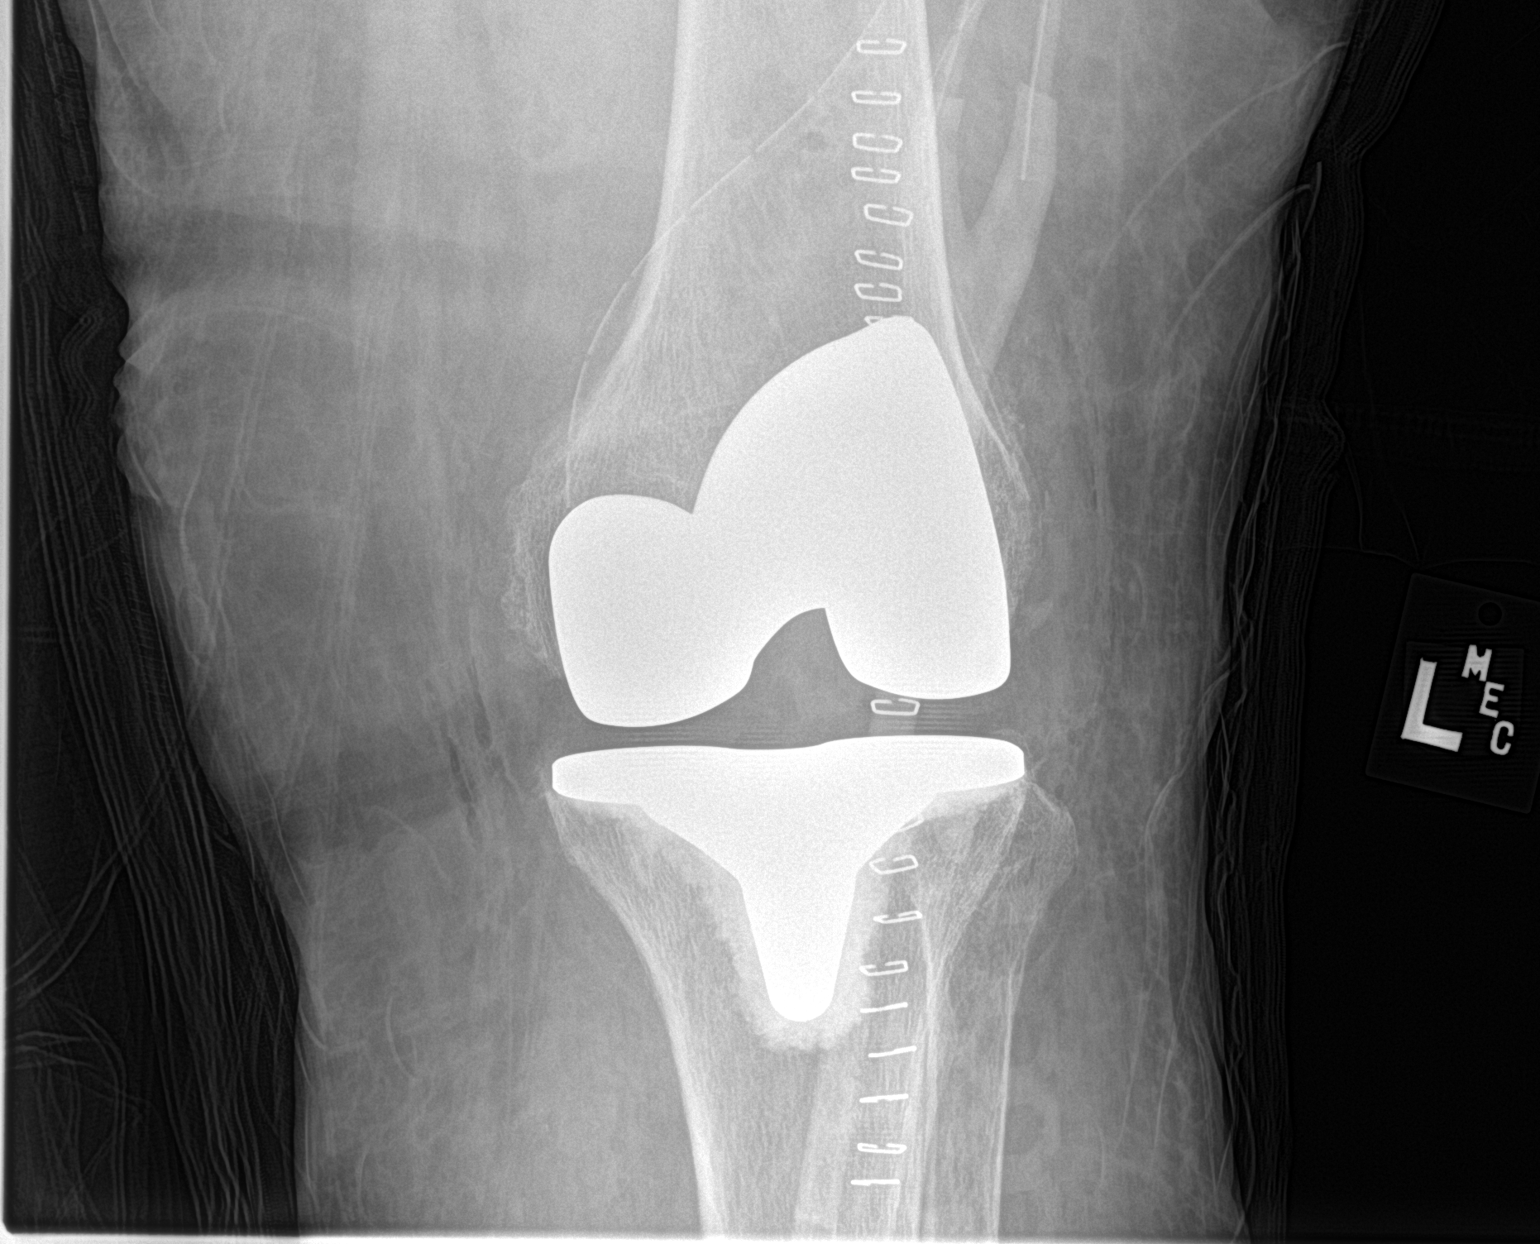

[knee lat]
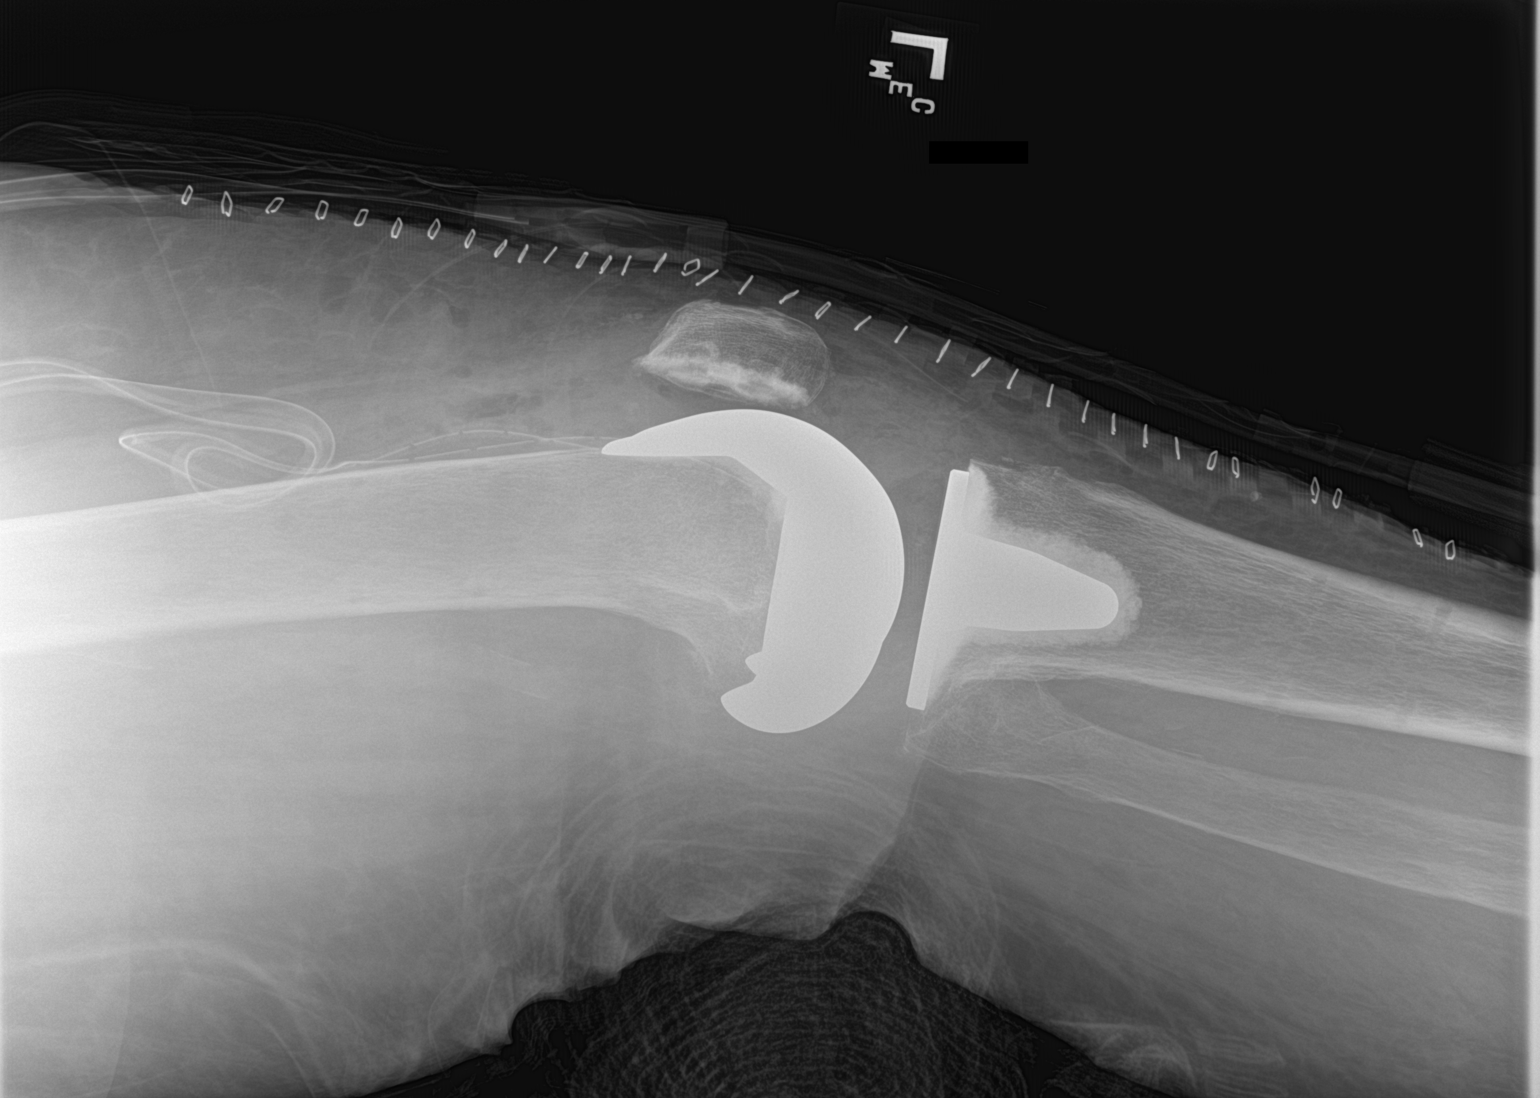

[2 of 2 positions shown; findings below may reference images not displayed]

FINDINGS: Status post total left knee arthroplasty. No evidence of fracture,
dislocation, or joint effusion. No evidence of arthropathy or other
focal bone abnormality. Subcutaneus soft tissue edema and emphysema
consistent with postsurgical changes. Overlying skin staples. Couple
of overlying surgical drain distal to the thigh.
IMPRESSION: Status post total left knee arthroplasty.

## 2024-01-06 DIAGNOSIS — I4819 Other persistent atrial fibrillation: Secondary | ICD-10-CM | POA: Diagnosis not present

## 2024-01-06 DIAGNOSIS — E7800 Pure hypercholesterolemia, unspecified: Secondary | ICD-10-CM | POA: Diagnosis not present

## 2024-02-01 DIAGNOSIS — Z96653 Presence of artificial knee joint, bilateral: Secondary | ICD-10-CM | POA: Diagnosis not present

## 2024-03-17 DIAGNOSIS — I10 Essential (primary) hypertension: Secondary | ICD-10-CM | POA: Diagnosis not present

## 2024-03-17 DIAGNOSIS — E7800 Pure hypercholesterolemia, unspecified: Secondary | ICD-10-CM | POA: Diagnosis not present

## 2024-03-17 DIAGNOSIS — R7303 Prediabetes: Secondary | ICD-10-CM | POA: Diagnosis not present

## 2024-03-24 DIAGNOSIS — I4819 Other persistent atrial fibrillation: Secondary | ICD-10-CM | POA: Diagnosis not present

## 2024-03-24 DIAGNOSIS — I10 Essential (primary) hypertension: Secondary | ICD-10-CM | POA: Diagnosis not present

## 2024-03-24 DIAGNOSIS — R7303 Prediabetes: Secondary | ICD-10-CM | POA: Diagnosis not present

## 2024-06-09 DIAGNOSIS — Z96653 Presence of artificial knee joint, bilateral: Secondary | ICD-10-CM | POA: Diagnosis not present

## 2024-06-09 DIAGNOSIS — S8001XA Contusion of right knee, initial encounter: Secondary | ICD-10-CM | POA: Diagnosis not present

## 2024-06-09 DIAGNOSIS — W010XXA Fall on same level from slipping, tripping and stumbling without subsequent striking against object, initial encounter: Secondary | ICD-10-CM | POA: Diagnosis not present

## 2024-06-09 DIAGNOSIS — S52131A Displaced fracture of neck of right radius, initial encounter for closed fracture: Secondary | ICD-10-CM | POA: Diagnosis not present

## 2024-06-09 DIAGNOSIS — S8011XA Contusion of right lower leg, initial encounter: Secondary | ICD-10-CM | POA: Diagnosis not present

## 2024-06-15 ENCOUNTER — Encounter: Payer: Self-pay | Admitting: Internal Medicine

## 2024-06-21 ENCOUNTER — Other Ambulatory Visit: Payer: Self-pay | Admitting: Internal Medicine

## 2024-06-21 DIAGNOSIS — R921 Mammographic calcification found on diagnostic imaging of breast: Secondary | ICD-10-CM

## 2024-06-21 DIAGNOSIS — Z1231 Encounter for screening mammogram for malignant neoplasm of breast: Secondary | ICD-10-CM

## 2024-06-27 DIAGNOSIS — S52131D Displaced fracture of neck of right radius, subsequent encounter for closed fracture with routine healing: Secondary | ICD-10-CM | POA: Diagnosis not present

## 2024-07-06 DIAGNOSIS — E7800 Pure hypercholesterolemia, unspecified: Secondary | ICD-10-CM | POA: Diagnosis not present

## 2024-07-06 DIAGNOSIS — I4819 Other persistent atrial fibrillation: Secondary | ICD-10-CM | POA: Diagnosis not present
